# Patient Record
Sex: Female | Born: 1995 | Race: Black or African American | Hispanic: No | Marital: Single | Smoking: Never smoker
Health system: Southern US, Community
[De-identification: ages and names within clinical notes are randomized; demographics above are authoritative.]

## PROBLEM LIST (undated history)

## (undated) DIAGNOSIS — I10 Essential (primary) hypertension: Secondary | ICD-10-CM

## (undated) DIAGNOSIS — T7840XA Allergy, unspecified, initial encounter: Secondary | ICD-10-CM

## (undated) DIAGNOSIS — E669 Obesity, unspecified: Secondary | ICD-10-CM

## (undated) HISTORY — PX: ADENOIDECTOMY: SUR15

## (undated) HISTORY — DX: Obesity, unspecified: E66.9

## (undated) HISTORY — DX: Allergy, unspecified, initial encounter: T78.40XA

## (undated) HISTORY — PX: WISDOM TOOTH EXTRACTION: SHX21

---
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
## 2016-01-15 ENCOUNTER — Ambulatory Visit: Payer: 59 | Admitting: Dietician

## 2016-02-03 ENCOUNTER — Encounter: Payer: 59 | Attending: Internal Medicine | Admitting: Dietician

## 2016-02-03 DIAGNOSIS — R7301 Impaired fasting glucose: Secondary | ICD-10-CM | POA: Insufficient documentation

## 2016-02-03 DIAGNOSIS — E669 Obesity, unspecified: Secondary | ICD-10-CM

## 2016-02-03 DIAGNOSIS — Z713 Dietary counseling and surveillance: Secondary | ICD-10-CM | POA: Diagnosis not present

## 2016-02-03 NOTE — Patient Instructions (Addendum)
Aim to eat 3 meals per day. For all meals, have protein with starch/carb For breakfast - try egg sandwich or fruit with cheese/meat/peanut butter/nuts/eggs Aim to fill half of your plate with vegetables, quarter of your plate protein, and a quarter of your plate starch/fruit. Keep frozen and ready to eat fresh vegetables on hand to add to meals. Try to eat at a table without distractions when possible. Try cutting back on sweet drinks. You can dilute them in water or have some juice in sparkling water. Or try adding fruit to water.  Try Diet V8 Splash (10 calories).  Look into joining the Y when your schedule gets better. (Goal is 150 minutes per weeks). Plan to cook 2 times per week now (crazy) and cook more when schedule gets better.

## 2016-02-03 NOTE — Progress Notes (Signed)
  Medical Nutrition Therapy:  Appt start time: 1115 end time:  1220.   Assessment:  Primary concerns today: Jordan Martin is here today since she would like to like to become healthier and not be overweight forever. She would like to be be able to "control what she eats". Feels like she doesn't make the best choices. Fasting blood sugar was 107 mg/dl in March. Does not remember if she ate before that test. Has a family hx of diabetes.   Went to [employer] for her 1st year of college and will be living at home and going to [employer] for the next 2 years. Did not feel comfortable at college and wanted to leave. Lives with her mom, brother and best friend. Everyone at home "fends for themselves". 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 Will miss breakfast a lot. Gets up at 10-11 AM most days. When she was at school was eating more food from the grocery store and at home eats all meals from fast food. Has been eating mostly fast food for meals since her dad died when she was 8. States that mom and brother don't eat very much since they are not hungry. Her best friend is interested in eating healthier now too.   Feels like she doesn't not just eat to eat but will eat when she is hungry.  Preferred Learning Style:   No preference indicated   Learning Readiness:   Ready   MEDICATIONS: none   DIETARY INTAKE:  Usual eating pattern includes 2 meals and 0 snacks per day.  Avoided foods include: onions, pretzels    24-hr recall:  B (10 -11 AM): McDonald's 2 sausage and cheese biscuits and fries  Snk ( AM): none  L ( PM): none Snk ( PM): non D (5-6 PM): Wendy's burger and fries or [employer] salad with fried or grilled chicken or chicken tenders Snk ( PM): non Beverages: gatorade, juice, water  Usual physical activity: walking sometimes but not regularly  Estimated energy needs: 2000 calories 225 g carbohydrates 150 g protein 56 g fat  Progress Towards Goal(s):   In progress.   Nutritional Diagnosis:  [place]-3.3 Overweight/obesity As related to hx of frequent fast food meals and sugar sweetened beverages.  As evidenced by BMI of 47.4.    Intervention:  Nutrition counseling provided. Plan: Aim to eat 3 meals per day. For all meals, have protein with starch/carb For breakfast - try egg sandwich or fruit with cheese/meat/peanut butter/nuts/eggs Aim to fill half of your plate with vegetables, quarter of your plate protein, and a quarter of your plate starch/fruit. Keep frozen and ready to eat fresh vegetables on hand to add to meals. Try to eat at a table without distractions when possible. Try cutting back on sweet drinks. You can dilute them in water or have some juice in sparkling water. Or try adding fruit to water.  Try Diet V8 Splash (10 calories).  Look into joining the Y when your schedule gets better. (Goal is 150 minutes per weeks). Plan to cook 2 times per week now (crazy) and cook more when schedule gets better.  Teaching Method Utilized:  Visual Auditory Hands on  Handouts given during visit include:  MyPlate Handout  15 g CHO Snacks  Meal Card  Barriers to learning/adherence to lifestyle change: none  Demonstrated degree of understanding via:  Teach Back   Monitoring/Evaluation:  Dietary intake, exercise, and body weight in 2 month(s).

## 2016-04-06 ENCOUNTER — Encounter: Payer: 59 | Attending: Internal Medicine | Admitting: Dietician

## 2016-04-06 DIAGNOSIS — IMO0002 Reserved for concepts with insufficient information to code with codable children: Secondary | ICD-10-CM

## 2016-04-06 DIAGNOSIS — Z713 Dietary counseling and surveillance: Secondary | ICD-10-CM | POA: Diagnosis not present

## 2016-04-06 DIAGNOSIS — Z68.41 Body mass index (BMI) pediatric, greater than or equal to 95th percentile for age: Secondary | ICD-10-CM

## 2016-04-06 DIAGNOSIS — R7301 Impaired fasting glucose: Secondary | ICD-10-CM | POA: Diagnosis present

## 2016-04-06 NOTE — Progress Notes (Signed)
  Medical Nutrition Therapy:  Appt start time: 200 end time:  230   Assessment:  Primary concerns today: Jordan Martin returns today with about a 1 lb weight loss. Has been drinking more water. On days when she can she adds more greens to her meals. 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 Lives just with her mom and brother now and trying to find a new living arrangement. Her friend moved out.  Still eating 1-2 meals per day. Feels like she has trouble eating more with her [employer] job. Tried G2 and doesn't like them much.   Planning to start using exercise bike for 1 hour each day.   She is looking into doing a service year and hopes to be gone by the end of the year.    Preferred Learning Style:   No preference indicated   Learning Readiness:   Ready   MEDICATIONS: none   DIETARY INTAKE:  Usual eating pattern includes 2 meals and 0 snacks per day.  Avoided foods include: onions, pretzels    24-hr recall:  B (8 AM): none or McDonald's sausage and cheese biscuits and fries  Snk ( AM): none  L ( PM): none or fruit Snk ( PM): none D (PM): eats what she cooks for the kids - spaghetti, [employer] meals, hot dogs or eats out Saturday, Sunday, and Monday Snk ( PM): none or sweets sometimes Beverages: 1 bottle gatorade, water  Usual physical activity: walking sometimes but not regularly  Estimated energy needs: 2000 calories 225 g carbohydrates 150 g protein 56 g fat  Progress Towards Goal(s):  In progress.   Nutritional Diagnosis:  [place]-3.3 Overweight/obesity As related to hx of frequent fast food meals and sugar sweetened beverages.  As evidenced by BMI of 47.4.    Intervention:  Nutrition counseling provided. Plan: Aim to eat 3 meals per day. Plan to go to the store when you get paid.  For all meals, have protein with starch/carb For breakfast - try egg sandwich or fruit with cheese/meat/peanut  butter/nuts/eggs Aim to fill half of your plate with vegetables, quarter of your plate protein, and a quarter of your plate starch/fruit. Keep frozen and ready to eat fresh vegetables on hand to add to meals. Continue cutting back on sweet drinks.  Try Diet V8 Splash (10 calories).  Limit meals to no more than 10 meals per week.  Plan to use exercise bike or elliptical in afternoon 5 x week  Teaching Method Utilized:  Visual Auditory Hands on  Handouts given during visit include:  none  Barriers to learning/adherence to lifestyle change: none  Demonstrated degree of understanding via:  Teach Back   Monitoring/Evaluation:  Dietary intake, exercise, and body weight in 2 month(s).

## 2016-04-06 NOTE — Patient Instructions (Addendum)
Aim to eat 3 meals per day. Plan to go to the store when you get paid.  For all meals, have protein with starch/carb For breakfast - try egg sandwich or fruit with cheese/meat/peanut butter/nuts/eggs Aim to fill half of your plate with vegetables, quarter of your plate protein, and a quarter of your plate starch/fruit. Keep frozen and ready to eat fresh vegetables on hand to add to meals. Continue cutting back on sweet drinks.  Try Diet V8 Splash (10 calories).  Limit meals to no more than 10 meals per week.  Plan to use exercise bike or elliptical in afternoon 5 x week.

## 2016-05-20 ENCOUNTER — Ambulatory Visit: Payer: 59 | Admitting: Internal Medicine

## 2016-06-09 ENCOUNTER — Ambulatory Visit: Payer: 59 | Admitting: Dietician

## 2016-06-16 ENCOUNTER — Encounter: Payer: 59 | Attending: Internal Medicine | Admitting: Dietician

## 2016-06-16 DIAGNOSIS — R7301 Impaired fasting glucose: Secondary | ICD-10-CM | POA: Diagnosis present

## 2016-06-16 DIAGNOSIS — Z713 Dietary counseling and surveillance: Secondary | ICD-10-CM | POA: Insufficient documentation

## 2016-06-16 NOTE — Progress Notes (Signed)
  Medical Nutrition Therapy:  Appt start time: 200 end time:  240   Assessment:  Primary concerns today: Jordan Martin returns today with no weight change. Has not made a lot of changes in her eating since she was last here about 6 weeks ago. Will be getting her own apartment soon (January).   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  Still eating 1-2 meals per day. Did not like G2 or Diet V8 Splash.   Eats ice a lot. Has been told her iron is low in the past (right on the cusp).   Looking into becoming a pescatarian but does not really like fish that much. Overall looking to cut back on pork on beef.   Preferred Learning Style:   No preference indicated   Learning Readiness:   Ready   MEDICATIONS: none   DIETARY INTAKE:  Usual eating pattern includes 2 meals and 0 snacks per day.  Avoided foods include: onions, pretzels    24-hr recall:  B (8 AM): none or [employer] sausage and cheese biscuits and bo rounds  Snk ( AM): none  L ( PM): none  Snk ( PM): none D (PM): eats what she cooks for the kids - spaghetti, [employer] meals, hot dogs or eats out Saturday, Sunday, and Monday Snk ( PM): none  Beverages: 1 bottle gatorade, water  Usual physical activity: 30 minute exercise app about 2-3 x week   Estimated energy needs: 2000 calories 225 g carbohydrates 150 g protein 56 g fat  Progress Towards Goal(s):  In progress.   Nutritional Diagnosis:  [place]-3.3 Overweight/obesity As related to hx of frequent fast food meals and sugar sweetened beverages.  As evidenced by BMI of 47.4.    Intervention:  Nutrition counseling provided. Plan: Start meal plan when you moved out of the house:  Aim to eat 3 meals per day. Plan to go to the store when you get paid.  For all meals, have protein (egg, yogurt, chicken, [place], cheese, peanut butter) with starch/carb For breakfast - try egg sandwich or fruit with cheese/meat/peanut  butter/nuts/eggs Aim to fill half of your plate with vegetables, quarter of your plate protein, and a quarter of your plate starch/fruit. Keep frozen and ready to eat fresh vegetables on hand to add to meals. Limit restaurant meals to no more than 3 meals per week.   Continue to use exercise app. Consider taking multivitamin with iron.   Teaching Method Utilized:  Visual Auditory Hands on  Handouts given during visit include:  none  Barriers to learning/adherence to lifestyle change: none  Demonstrated degree of understanding via:  Teach Back   Monitoring/Evaluation:  Dietary intake, exercise, and body weight in 2 month(s).

## 2016-06-16 NOTE — Patient Instructions (Addendum)
Start meal plan when you moved out of the house:  Aim to eat 3 meals per day. Plan to go to the store when you get paid.  For all meals, have protein (egg, yogurt, chicken, [place], cheese, peanut butter) with starch/carb For breakfast - try egg sandwich or fruit with cheese/meat/peanut butter/nuts/eggs Aim to fill half of your plate with vegetables, quarter of your plate protein, and a quarter of your plate starch/fruit. Keep frozen and ready to eat fresh vegetables on hand to add to meals. Limit restaurant meals to no more than 3 meals per week.   Continue to use exercise app. Consider taking multivitamin with iron.

## 2016-08-19 ENCOUNTER — Ambulatory Visit: Payer: 59 | Admitting: Dietician

## 2016-08-19 NOTE — Progress Notes (Signed)
Pre visit review using our clinic review tool, if applicable. No additional management support is needed unless otherwise documented below in the visit note.  Chief Complaint  Patient presents with  . Referral    Pt would like to go to thereapy.    HPI: Jordan Martin 21 y.o. come in for Consideration of seeing a counselor because she feels anxious and somewhat down. He states that she is  Going through stress   And   did well 3.0 at [employer] first semester and then second semester some difficulties with she thought was a friend and losing other friends on campus and felt isolated and more anxious. She didn't reenroll for this master and came home. However she feels more anxious at home and wants to move on and get through. Stopped school for  [employer] and will go bavck to [facility] and then perhaps [facility] ? If from anxiety  depression  When was in [place] and anxious and now  Gotten worse.  HOme  3 br own room . 5   Not used to that.   Moms bf iuncle brother .   bp last 130/92 she has been forgetting to take the blood pressure medication but will take it if it is refill. She is seeing nutritionist which has been helpful. Not suicidal but sometimes feels that if she was not there it would be better. Sleeping okay. Some fatigue. Neg tad  No traumatic episodes but father died sudden MI when she was 8 years  ROS: See pertinent positives and negatives per HPI.  Past Medical History:  Diagnosis Date  . Allergy   . Obesity     Family History  Problem Relation Age of Onset  . Hypertension Mother   . Heart attack Father   . Diabetes Father   . Hyperlipidemia Father   . Aneurysm Father 40    brain aneurysm    Social History   Social History  . Marital status: Single    Spouse name: N/A  . Number of children: N/A  . Years of education: N/A   Social History Main Topics  . Smoking status: Never Smoker  . Smokeless tobacco: Never Used  . Alcohol use No  . Drug use: No  . Sexual  activity: No   Other Topics Concern  . None   Social History Narrative   36 to [redacted] weeks gestation 8 lbs. 3 oz. Delivered by C-section.   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   No pets .   [employer] came   Home after first semester  Social issues  3.0 average plans to go back to school    Outpatient Medications Prior to Visit  Medication Sig Dispense Refill  . naproxen (NAPROSYN) 250 MG tablet Take 1 tablet (250 mg total) by mouth 2 (two) times daily with a meal. 30 tablet 0  . triamterene-hydrochlorothiazide (MAXZIDE-25) 37.5-25 MG tablet Take 0.5 tablets by mouth daily. High blood pressure 90 tablet 1  . fluticasone (FLONASE) 50 MCG/ACT nasal spray 2 spray each nostril qd (Patient not taking: Reported on 08/21/2016) 16 g 11  . Norethindrone Acet-Ethinyl Est (MICROGESTIN 1.5/30  PO) Take 1 tablet by mouth daily.     No facility-administered medications prior to visit.      EXAM:  BP (!) 156/86 (BP Location: Right Arm, Patient Position: Sitting, Cuff Size: Large)   Temp 98.5 F (36.9 C) (Oral)   Wt 283 lb (128.4 kg)   BMI 47.09 kg/m   Body mass index is 47.09 kg/m.  GENERAL: vitals reviewed and listed above, alert, oriented, appears well hydrated and in no acute distress HEENT: atraumatic, conjunctiva  clear, no obvious abnormalities on inspection of external nose and ears PSYCH: pleasant and cooperative, Emotional at times but oriented normal thought process and speech. Lab Results  Component Value Date   WBC 6.0 10/08/2014   HGB 11.2 (L) 10/08/2014   HCT 34.1 (L) 10/08/2014   PLT 330.0 10/08/2014   GLUCOSE 107 (H) 09/24/2015   CHOL 123 10/08/2014   TRIG 44.0 10/08/2014   HDL 41.90 10/08/2014   LDLCALC 72 10/08/2014   ALT 17  10/08/2014   AST 19 10/08/2014   NA 140 09/24/2015   K 4.4 09/24/2015   CL 105 09/24/2015   CREATININE 0.80 09/24/2015   BUN 12 09/24/2015   CO2 26 09/24/2015   TSH 1.41 10/08/2014   HGBA1C 5.4 11/18/2015   BP Readings from Last 3 Encounters:  08/21/16 (!) 156/86  11/18/15 126/80  07/30/15 (!) 150/96   PHQ-SADS Somatic: 8 ha gi  tired GAD7:13 no panic  Worry dominant PHQ9:11 sleep and self feeling bad  Difficulty : very    ASSESSMENT AND PLAN:  Discussed the following assessment and plan:  Adjustment reaction with anxiety and depression - disc rx intervnetion expectaions  appt with counselor   Hypertension, unspecified type - need fu back o nmed and evaluate   1-2 months  Medication management She is overdue for follow-up of blood pressure and metabolic monitoring. She can continue with the nutritionist. [facility] and then follow-up with me 1-2 months. Discussed counseling appropriate for this situation medicine only if systolic and not moving through these issues. Patient was told she can contact us sign up from  My  chart things we need to help in the interim. It is good that she has dreams to continue goals. Total visit 30mins > 50% spent counseling and coordinating care as indicated in above note and in instructions to patient .  -Patient advised to return or notify health care team  if  new concerns arise.  Patient Instructions  Go back on the  The  maxide  For BP  And then plan  FU in  1-2   months   I agree with counseling help confidential to help get through   the anxiety and depressive sx  .  For reasons we discussed .   Get back with us earlier if needed  [facility]  Can check  On line   Or  [phone]  Check  If  Counselor is in network .  Get sleep  Some exercise  And outside time  .    Day light  . Is a mild antidepressant and  Anxiety help.      Neta MendsWanda K. Gareld Martin M.D.  Lab Results  Component Value Date   WBC 6.0  10/08/2014   HGB 11.2 (L) 10/08/2014   HCT 34.1 (L) 10/08/2014   PLT 330.0 10/08/2014   GLUCOSE 107 (H) 09/24/2015   CHOL 123 10/08/2014   TRIG 44.0 10/08/2014   HDL 41.90 10/08/2014  LDLCALC 72 10/08/2014   ALT 17 10/08/2014   AST 19 10/08/2014   NA 140 09/24/2015   K 4.4 09/24/2015   CL 105 09/24/2015   CREATININE 0.80 09/24/2015   BUN 12 09/24/2015   CO2 26 09/24/2015   TSH 1.41 10/08/2014   HGBA1C 5.4 11/18/2015

## 2016-08-21 ENCOUNTER — Encounter: Payer: Self-pay | Admitting: Internal Medicine

## 2016-08-21 ENCOUNTER — Ambulatory Visit (INDEPENDENT_AMBULATORY_CARE_PROVIDER_SITE_OTHER): Payer: 59 | Admitting: Internal Medicine

## 2016-08-21 VITALS — BP 156/86 | Temp 98.5°F | Wt 283.0 lb

## 2016-08-21 DIAGNOSIS — F4323 Adjustment disorder with mixed anxiety and depressed mood: Secondary | ICD-10-CM

## 2016-08-21 DIAGNOSIS — I10 Essential (primary) hypertension: Secondary | ICD-10-CM

## 2016-08-21 DIAGNOSIS — Z79899 Other long term (current) drug therapy: Secondary | ICD-10-CM | POA: Diagnosis not present

## 2016-08-21 MED ORDER — TRIAMTERENE-HCTZ 37.5-25 MG PO TABS: 0.5000 | ORAL_TABLET | Freq: Every day | ORAL | 1 refills | Status: DC

## 2016-08-21 MED ORDER — FLUTICASONE PROPIONATE 50 MCG/ACT NA SUSP: NASAL | 11 refills | Status: DC

## 2016-08-21 NOTE — Patient Instructions (Addendum)
Go back on the  The  maxide  For BP  And then plan  FU in  1-2   months   I agree with counseling help confidential to help get through   the anxiety and depressive sx  .  For reasons we discussed .   Get back with us earlier if needed  [facility]  Can check  On line   Or  [phone]  Check  If  Counselor is in network .  Get sleep  Some exercise  And outside time  .    Day light  . Is a mild antidepressant and  Anxiety help.

## 2016-09-01 ENCOUNTER — Ambulatory Visit (INDEPENDENT_AMBULATORY_CARE_PROVIDER_SITE_OTHER): Payer: 59 | Admitting: Licensed Clinical Social Worker

## 2016-09-01 DIAGNOSIS — F329 Major depressive disorder, single episode, unspecified: Secondary | ICD-10-CM | POA: Diagnosis not present

## 2016-09-16 ENCOUNTER — Ambulatory Visit: Payer: 59 | Admitting: Licensed Clinical Social Worker

## 2016-09-23 ENCOUNTER — Ambulatory Visit: Payer: 59 | Admitting: Licensed Clinical Social Worker

## 2016-09-29 ENCOUNTER — Ambulatory Visit: Payer: 59 | Admitting: Licensed Clinical Social Worker

## 2016-10-19 ENCOUNTER — Ambulatory Visit: Payer: 59 | Admitting: Internal Medicine

## 2016-10-19 NOTE — Progress Notes (Deleted)
No chief complaint on file.   HPI: Jordan Martin 21 y.o. come in for Chronic disease management  Fu  bp readings  Told to restart  meds  Adjustment reaction   Adjustment reaction with anxiety and depression - disc rx intervnetion expectaions  appt with counselor   Hypertension, unspecified type - need fu back o nmed and evaluate   1-2 months  Medication management She is overdue for follow-up of blood pressure and metabolic monitoring. She can continue with the nutritionist. She will restart the [facility] and then follow-up with me 1-2 months. Discussed counseling appropriate for this situation medicine only if systolic and not moving through these issues. Patient was told she can contact us sign up from  My  chart things we need to help in the interim. It is good that she has dreams to continue goals. Total visit > 50% spent counseling and coordinating care as indicated in above note and in instructions to patient .  -Patient advised to return or notify health care team  if  new concerns arise.  Patient Instructions  Go back on the  The  maxide  For BP  And then plan  FU in  1-2   months   I agree with counseling help confidential to help get through   the anxiety and depressive sx  .  For reasons we discussed .   Get back with [place] earlier if needed  [facility]  Can check  On line   Or  [phone]  Check  If  Counselor is in network .  Get sleep  Some exercise  And outside time  .    Day light  . Is a mild antidepressant and  Anxiety help.      Neta Mends. Jordan Martin M.D.  Recent Labs    ROS: See pertinent positives and negatives per HPI.  Past Medical History:  Diagnosis Date  . Allergy   . Obesity     Family History  Problem Relation Age of Onset  . Hypertension Mother   . Heart attack Father   . Diabetes Father   . Hyperlipidemia Father   . Aneurysm Father 40    brain aneurysm    Social History   Social History  . Marital  status: Single    Spouse name: N/A  . Number of children: N/A  . Years of education: N/A   Social History Main Topics  . Smoking status: Never Smoker  . Smokeless tobacco: Never Used  . Alcohol use No  . Drug use: No  . Sexual activity: No   Other Topics Concern  . Not on file   Social History Narrative   36 to [redacted] weeks gestation 8 lbs. 3 oz. Delivered by C-section.   No neonatal problems       [place]   Household of 5  with mom no DTS pet   Father died of sudden heart attack when he was in his 69s when [name] was younger  Age 66    Mom   [name] working full-time now Arts administrator   Two older siblings in good health one in the [employer].   No pets .   [employer] came   Home after first semester  Social issues  3.0 average plans to go back to school    Outpatient Medications Prior to Visit  Medication Sig Dispense Refill  . fluticasone (FLONASE) 50 MCG/ACT nasal spray 2 spray each nostril qd 16 g 11  .  naproxen (NAPROSYN) 250 MG tablet Take 1 tablet (250 mg total) by mouth 2 (two) times daily with a meal. 30 tablet 0  . triamterene-hydrochlorothiazide (MAXZIDE-25) 37.5-25 MG tablet Take 0.5 tablets by mouth daily. High blood pressure 90 tablet 1   No facility-administered medications prior to visit.      EXAM:  There were no vitals taken for this visit.  There is no height or weight on file to calculate BMI.  GENERAL: vitals reviewed and listed above, alert, oriented, appears well hydrated and in no acute distress HEENT: atraumatic, conjunctiva  clear, no obvious abnormalities on inspection of external nose and ears OP : no lesion edema or exudate  NECK: no obvious masses on inspection palpation  LUNGS: clear to auscultation bilaterally, no wheezes, rales or rhonchi, good air movement CV: HRRR, no clubbing cyanosis or  peripheral edema nl cap refill  MS: moves all extremities without noticeable focal  abnormality PSYCH: pleasant and cooperative, no obvious depression or  anxiety Lab Results  Component Value Date   WBC 6.0 10/08/2014   HGB 11.2 (L) 10/08/2014   HCT 34.1 (L) 10/08/2014   PLT 330.0 10/08/2014   GLUCOSE 107 (H) 09/24/2015   CHOL 123 10/08/2014   TRIG 44.0 10/08/2014   HDL 41.90 10/08/2014   LDLCALC 72 10/08/2014   ALT 17 10/08/2014   AST 19 10/08/2014   NA 140 09/24/2015   K 4.4 09/24/2015   CL 105 09/24/2015   CREATININE 0.80 09/24/2015   BUN 12 09/24/2015   CO2 26 09/24/2015   TSH 1.41 10/08/2014   HGBA1C 5.4 11/18/2015   BP Readings from Last 3 Encounters:  08/21/16 (!) 156/86  11/18/15 126/80  07/30/15 (!) 150/96   Wt Readings from Last 3 Encounters:  08/21/16 283 lb (128.4 kg) (>99 %, Z= 2.75)*  06/16/16 283 lb 8 oz (128.6 kg) (>99 %, Z= 2.74)*  04/06/16 283 lb 8 oz (128.6 kg) (>99 %, Z= 2.73)*   * Growth percentiles are based on CDC 2-20 Years data.    ASSESSMENT AND PLAN:  Discussed the following assessment and plan:  No diagnosis found. Lab today  Vs fasting  Lab   -Patient advised to return or notify health care team  if  new concerns arise.  There are no Patient Instructions on file for this visit.   Neta Mends. Emanuelle Hammerstrom M.D.

## 2017-05-24 IMAGING — DX DG ANKLE COMPLETE 3+V*L*
3 series · 3 of 3 positions shown · non-contrast
Comparison: None.

CLINICAL DATA: Pain and swelling in the left ankle over the past 4
days without known injury.

EXAM:
LEFT ANKLE COMPLETE - 3+ VIEW

[ankle ap]
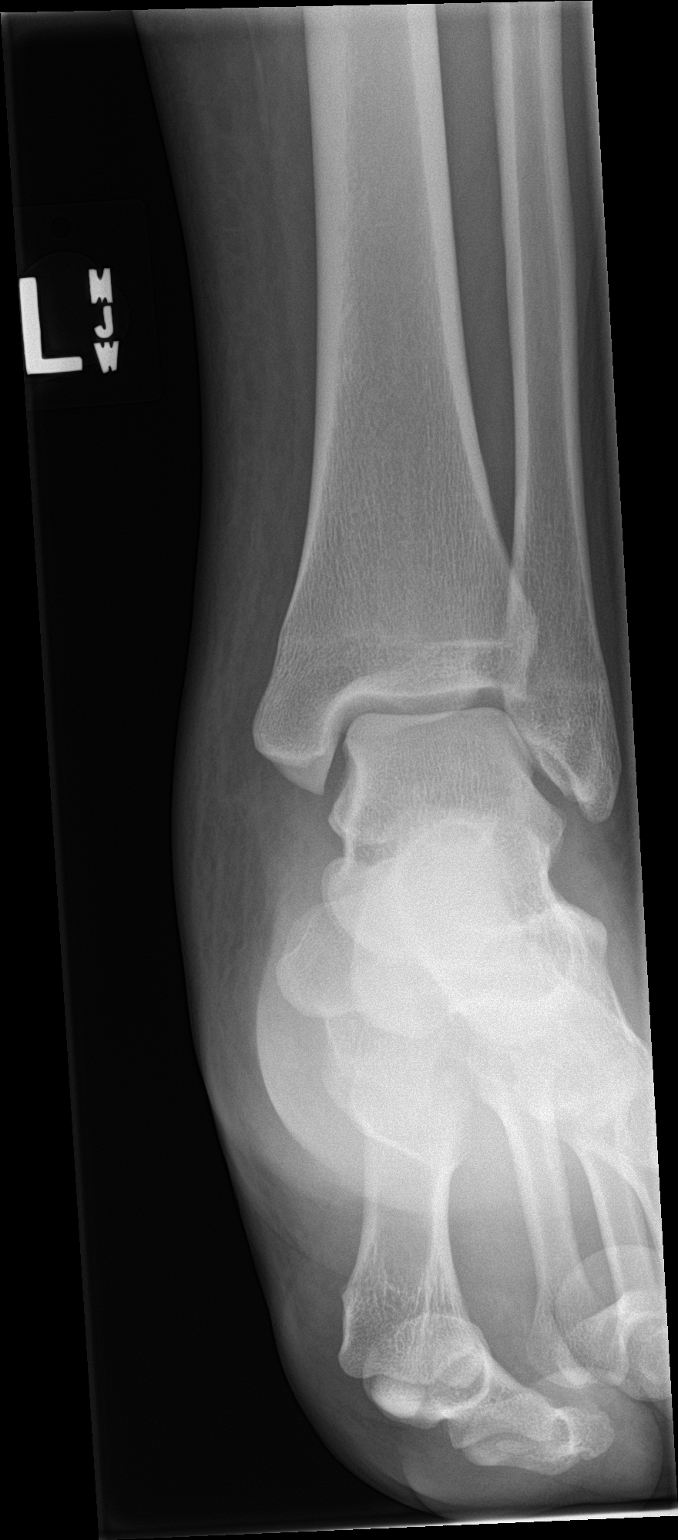

[ankle obl]
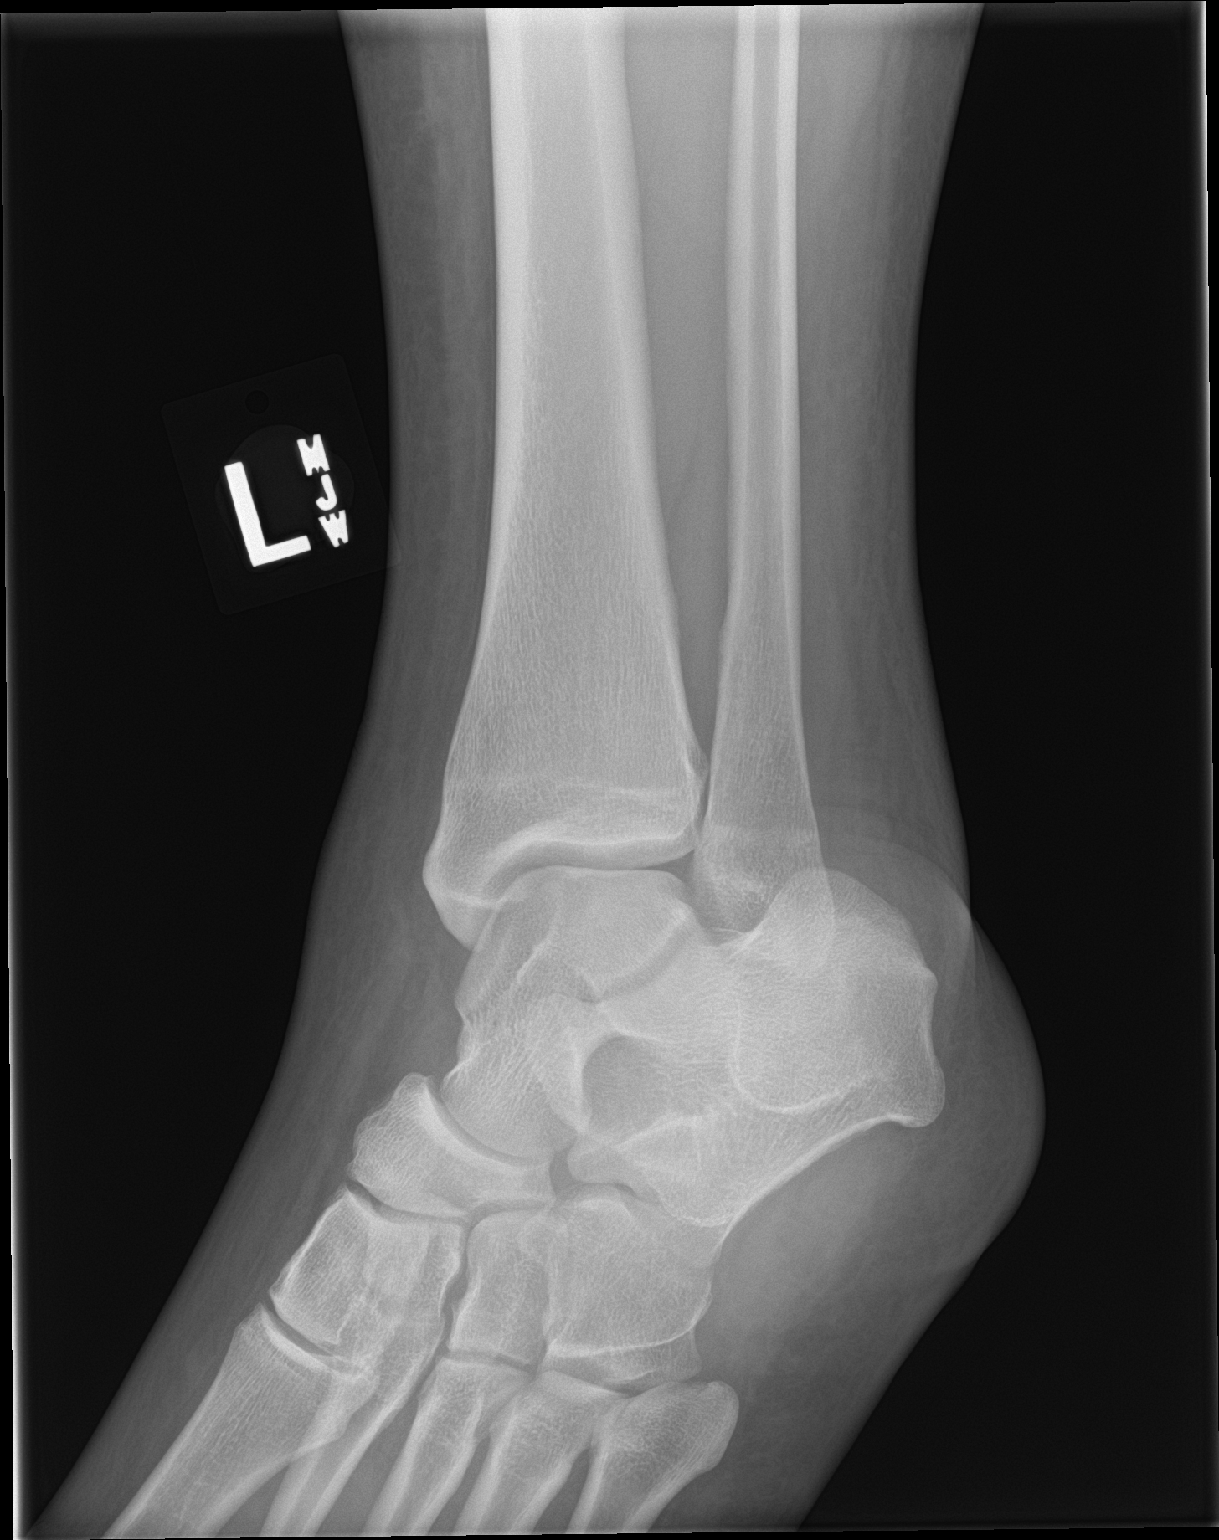

[ankle lat]
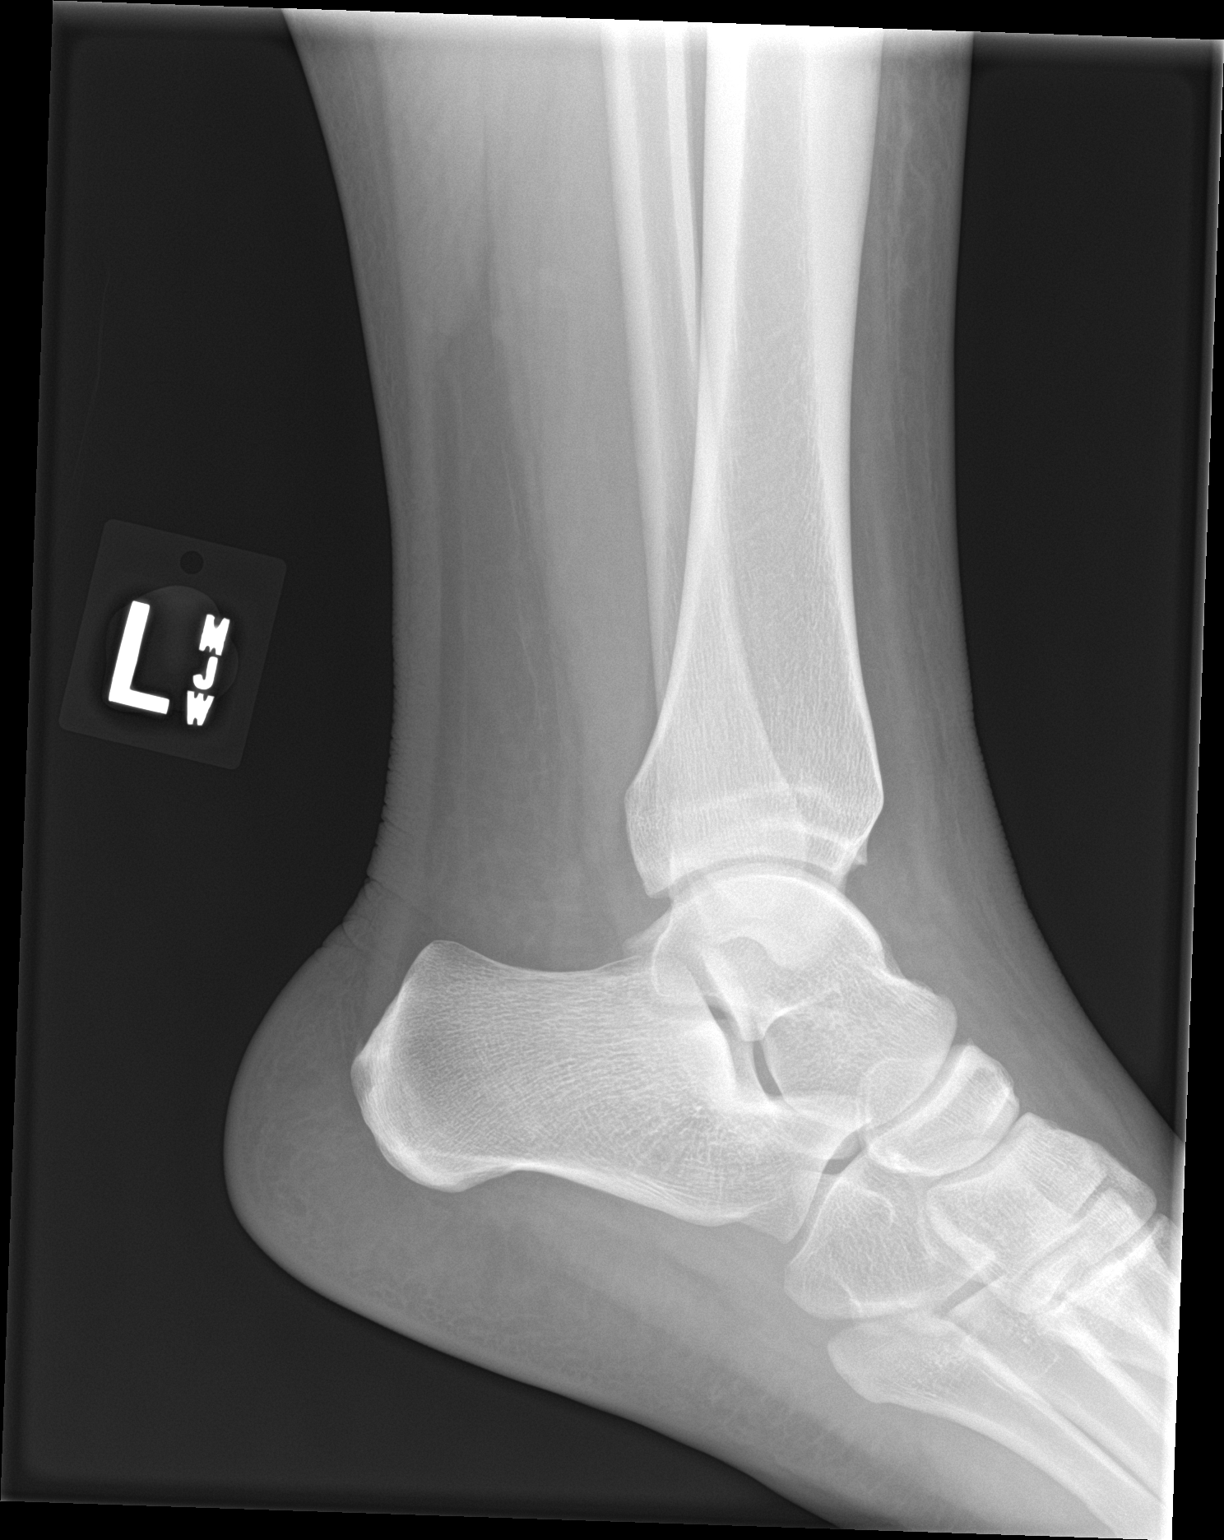

[3 of 3 positions shown; findings below may reference images not displayed]

FINDINGS: Soft tissue swelling overlies the medial malleolus, and likely the
lateral malleolus based on the oblique view. No underlying fracture.
Plafond and talar dome intact. No specific hindfoot or midfoot bony
abnormality is radiographically apparent.
IMPRESSION: 1. Soft tissue swelling over the malleoli without underlying bony
abnormality.

## 2017-08-30 ENCOUNTER — Emergency Department (HOSPITAL_COMMUNITY)
Admission: EM | Admit: 2017-08-30 | Discharge: 2017-08-30 | Disposition: A | Discharge status: Home / Self Care | Payer: 59 | Attending: Emergency Medicine | Admitting: Emergency Medicine

## 2017-08-30 ENCOUNTER — Other Ambulatory Visit: Payer: Self-pay | Admitting: Internal Medicine

## 2017-08-30 ENCOUNTER — Encounter (HOSPITAL_COMMUNITY): Payer: Self-pay | Admitting: Emergency Medicine

## 2017-08-30 ENCOUNTER — Emergency Department (HOSPITAL_COMMUNITY): Payer: 59

## 2017-08-30 DIAGNOSIS — I10 Essential (primary) hypertension: Secondary | ICD-10-CM | POA: Diagnosis not present

## 2017-08-30 DIAGNOSIS — R0789 Other chest pain: Secondary | ICD-10-CM

## 2017-08-30 DIAGNOSIS — Z79899 Other long term (current) drug therapy: Secondary | ICD-10-CM | POA: Diagnosis not present

## 2017-08-30 DIAGNOSIS — R079 Chest pain, unspecified: Secondary | ICD-10-CM | POA: Diagnosis present

## 2017-08-30 HISTORY — DX: Essential (primary) hypertension: I10

## 2017-08-30 LAB — BASIC METABOLIC PANEL
Anion gap: 9 (ref 5–15)
BUN: 12 mg/dL (ref 6–20)
CALCIUM: 9.2 mg/dL (ref 8.9–10.3)
CHLORIDE: 106 mmol/L (ref 101–111)
CO2: 21 mmol/L — AB (ref 22–32)
CREATININE: 0.84 mg/dL (ref 0.44–1.00)
GFR calc Af Amer: >60 mL/min (ref >60)
GFR calc non Af Amer: >60 mL/min (ref >60)
Glucose, Bld: 96 mg/dL (ref 65–99)
Potassium: 3.7 mmol/L (ref 3.5–5.1)
SODIUM: 136 mmol/L (ref 135–145)

## 2017-08-30 LAB — CBC
HCT: 37.5 % (ref 36.0–46.0)
Hemoglobin: 12.5 g/dL (ref 12.0–15.0)
MCH: 26.9 pg (ref 26.0–34.0)
MCHC: 33.3 g/dL (ref 30.0–36.0)
MCV: 80.8 fL (ref 78.0–100.0)
PLATELETS: 313 10*3/uL (ref 150–400)
RBC: 4.64 MIL/uL (ref 3.87–5.11)
RDW: 14.8 % (ref 11.5–15.5)
WBC: 7.2 10*3/uL (ref 4.0–10.5)

## 2017-08-30 LAB — I-STAT TROPONIN, ED: Troponin i, poc: 0 ng/mL (ref 0.00–0.08)

## 2017-08-30 LAB — I-STAT BETA HCG BLOOD, ED (MC, WL, AP ONLY): I-stat hCG, quantitative: <5 m[IU]/mL (ref <5)

## 2017-08-30 NOTE — ED Provider Notes (Signed)
[facility]-EMERGENCY DEPT Provider Note   CSN: [id number] Arrival date & time: 08/30/17  0807     History   Chief Complaint Chief Complaint  Patient presents with  . Chest Pain    HPI Jordan Martin is a 22 y.o. female.  22 yo F with a chief complaint of chest pain.  This is left-sided started last night.  Worse with movement palpation and deep breaths. Denies trauma, denies cough, congestion fever.  Denies hemoptysis.  Denies leg swelling.  No hx of MI.  Non smoker. HTN.  Denies DM.  Father with MI in the 5740's.  Denies prior PE or DVT.  Denies recent hospitalization denies recent surgery denies prone travel denies birth control use.   The history is provided by the patient.  Chest Pain   This is a new problem. The current episode started 2 days ago. The problem occurs constantly. The problem has not changed since onset.The pain is at a severity of 4/10. The pain is mild. The quality of the pain is described as pleuritic. The pain does not radiate. Duration of episode(s) is 2 days. Pertinent negatives include no dizziness, no fever, no headaches, no nausea, no palpitations, no shortness of breath and no vomiting. She has tried nothing for the symptoms. The treatment provided no relief. There are no known risk factors.  Pertinent negatives for past medical history include no DVT, no MI and no PE.  Her family medical history is significant for early MI (dad in 6940's).    Past Medical History:  Diagnosis Date  . Allergy   . Hypertension   . Obesity     Patient Active Problem List   Diagnosis Date Noted  . Tonsillar hypertrophy 07/31/2015  . Elevated BP 11/09/2014  . 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 Nail abnormality 10/08/2014  . BMI (body mass index), pediatric, > 99% for age 40/28/2016  . Tremor 12/27/2012  . Well adolescent visit 12/26/2012  . Acanthosis nigricans, acquired 10/06/2010  . Allergic rhinitis, seasonal 10/06/2010  . OBESITY 04/01/2007     Past Surgical History:  Procedure Laterality Date  . ADENOIDECTOMY    . WISDOM TOOTH EXTRACTION      OB History    No data available       Home Medications    Prior to Admission medications   Medication Sig Start Date End Date Taking? Authorizing Provider  fluticasone (FLONASE) 50 MCG/ACT nasal spray 2 spray each nostril qd Patient taking differently: Place 2 sprays into both nostrils daily as needed for allergies.  08/21/16  Yes Panosh, Neta MendsWanda K, MD  triamterene-hydrochlorothiazide (MAXZIDE-25) 37.5-25 MG tablet Take 0.5 tablets by mouth daily. High blood pressure 08/21/16  Yes Panosh, Neta MendsWanda K, MD    Family History Family History  Problem Relation Age of Onset  . Hypertension Mother   . Heart attack Father   . Diabetes Father   . Hyperlipidemia Father   . Aneurysm Father 40       brain aneurysm    Social History Social History   Tobacco Use  . Smoking status: Never Smoker  . Smokeless tobacco: Never Used  Substance Use Topics  . Alcohol use: No    Alcohol/week: 0.0 oz  . Drug use: Yes    Types: Marijuana     Allergies   Patient has no known allergies.   Review of Systems Review of Systems  Constitutional: Negative for chills and fever.  HENT: Negative for congestion and rhinorrhea.   Eyes:  Negative for redness and visual disturbance.  Respiratory: Negative for shortness of breath and wheezing.   Cardiovascular: Negative for chest pain and palpitations.  Gastrointestinal: Negative for nausea and vomiting.  Genitourinary: Negative for dysuria and urgency.  Musculoskeletal: Negative for arthralgias and myalgias.  Skin: Negative for pallor and wound.  Neurological: Negative for dizziness and headaches.     Physical Exam Updated Vital Signs BP (!) 158/109 (BP Location: Left Arm)   Pulse 64   Temp 97.9 F (36.6 C) (Oral)   Resp 19   LMP 08/06/2017   SpO2 97%   Physical Exam  Constitutional: She is oriented to person, place, and time. She appears  well-developed and well-nourished. No distress.  obese  HENT:  Head: Normocephalic and atraumatic.  Eyes: EOM are normal. Pupils are equal, round, and reactive to light.  Neck: Normal range of motion. Neck supple.  Cardiovascular: Normal rate and regular rhythm. Exam reveals no gallop and no friction rub.  No murmur heard. Pulmonary/Chest: Effort normal. She has no wheezes. She has no rales. She exhibits tenderness (along left chest wall, mid claviclar line about ribs 3-5).  Abdominal: Soft. She exhibits no distension. There is no tenderness.  Musculoskeletal: She exhibits no edema or tenderness.  Neurological: She is alert and oriented to person, place, and time.  Skin: Skin is warm and dry. She is not diaphoretic.  Psychiatric: She has a normal mood and affect. Her behavior is normal.  Nursing note and vitals reviewed.    ED Treatments / Results  Labs (all labs ordered are listed, but only abnormal results are displayed) Labs Reviewed  BASIC METABOLIC PANEL - Abnormal; Notable for the following components:      Result Value   CO2 21 (*)    All other components within normal limits  CBC  I-STAT TROPONIN, ED  I-STAT BETA HCG BLOOD, ED ([facility], [facility], [facility] ONLY)    EKG  EKG Interpretation  Date/Time:  Monday August 30 2017 08:16:34 EST Ventricular Rate:  63 PR Interval:    QRS Duration: 76 QT Interval:  418 QTC Calculation: 428 R Axis:   47 Text Interpretation:  Sinus rhythm No significant change since last tracing Confirmed by Melene Plan [phone]) on 08/30/2017 9:08:14 AM       Radiology Dg Chest 2 View  Result Date: 08/30/2017 CLINICAL DATA:  Chest pain EXAM: CHEST  2 VIEW COMPARISON:  None. FINDINGS: Normal heart size and mediastinal contours. No acute infiltrate or edema. No effusion or pneumothorax. No acute osseous findings. IMPRESSION: Negative chest. Electronically Signed   By: Marnee Spring M.D.   On: 08/30/2017 08:39    Procedures Procedures (including critical  care time)  Medications Ordered in ED Medications - No data to display   Initial Impression / Assessment and Plan / ED Course  I have reviewed the triage vital signs and the nursing notes.  Pertinent labs & imaging results that were available during my care of the patient were reviewed by me and considered in my medical decision making (see chart for details).     22 yo F with a chief complaint of left-sided chest pain.  Completely atypical from ACS.  PERC negative, labs ecg cxr reassuring d/c home.   9:35 AM:  I have discussed the diagnosis/risks/treatment options with the patient and family and believe the pt to be eligible for discharge home to follow-up with PCP. We also discussed returning to the ED immediately if new or worsening sx occur. We discussed the  sx which are most concerning (e.g., sudden worsening pain, fever, inability to tolerate by mouth) that necessitate immediate return. Medications administered to the patient during their visit and any new prescriptions provided to the patient are listed below.  Medications given during this visit Medications - No data to display   The patient appears reasonably screen and/or stabilized for discharge and I doubt any other medical condition or other [facility] requiring further screening, evaluation, or treatment in the ED at this time prior to discharge.    Final Clinical Impressions(s) / ED Diagnoses   Final diagnoses:  Chest wall pain    ED Discharge Orders    None       Melene Plan, DO 08/30/17 1610

## 2017-08-30 NOTE — Discharge Instructions (Signed)
Take 4 over the counter ibuprofen tablets 3 times a day or 2 over-the-counter naproxen tablets twice a day for pain. Also take tylenol 1000mg(2 extra strength) four times a day.    

## 2017-08-30 NOTE — ED Triage Notes (Signed)
Patient reports left sided chest pain that started yesterday when she moves her arm or takes a deep breath.

## 2017-11-15 ENCOUNTER — Other Ambulatory Visit: Payer: Self-pay | Admitting: Internal Medicine

## 2017-12-27 ENCOUNTER — Ambulatory Visit: Payer: Self-pay | Admitting: Internal Medicine

## 2017-12-27 DIAGNOSIS — Z0289 Encounter for other administrative examinations: Secondary | ICD-10-CM

## 2017-12-27 NOTE — Progress Notes (Deleted)
No chief complaint on file.   HPI: Jordan Martin 22 y.o. come in for  "fu"  Last seen feberuary 9   For  adsjutment reacitoin and elevated bp   Adjustment reaction with anxiety and depression - disc rx intervnetion expectaions  appt with counselor   Hypertension, unspecified type - need fu back o nmed and evaluate   1-2 months  Medication management She is overdue for follow-up of blood pressure and metabolic monitoring. She can continue with the nutritionist. She will restart the [facility] and then follow-up with me 1-2 months. Discussed counseling appropriate for this situation medicine only if systolic and not moving through these issues. Patient was told she can contact us sign up from  My  chart things we need to help in the interim. It is good that she has dreams to continue goals. Total visit 30mins > 50% spent counseling and coordinating care as indicated in above note and in instructions to patient .  -Patient advised to return or notify health care team  if  new concerns arise.  Patient Instructions  Go back on the  The  maxide  For BP  And then plan  FU in  1-2   months   I agree with counseling help confidential to help get through   the anxiety and depressive sx  .  For reasons we discussed .   Get back with us earlier if needed  [place] [facility]  Can check  On line   Or  [phone]  Check  If  Counselor is in network .  Get sleep  Some exercise  And outside time  .    Day light  . Is a mild antidepressant and  Anxiety help.     ROS: See pertinent positives and negatives per HPI.  Past Medical History:  Diagnosis Date  . Allergy   . Hypertension   . Obesity     Family History  Problem Relation Age of Onset  . Hypertension Mother   . Heart attack Father   . Diabetes Father   . Hyperlipidemia Father   . Aneurysm Father 40       brain aneurysm    Social History   Socioeconomic History  . Marital status: Single    Spouse name: Not on  file  . Number of children: Not on file  . Years of education: Not on file  . Highest education level: Not on file  Occupational History  . Not on file  Social Needs  . Financial resource strain: Not on file  . Food insecurity:    Worry: Not on file    Inability: Not on file  . Transportation needs:    Medical: Not on file    Non-medical: Not on file  Tobacco Use  . Smoking status: Never Smoker  . Smokeless tobacco: Never Used  Substance and Sexual Activity  . Alcohol use: No    Alcohol/week: 0.0 oz  . Drug use: Yes    Types: Marijuana  . Sexual activity: Never    Birth control/protection: Abstinence  Lifestyle  . Physical activity:    Days per week: Not on file    Minutes per session: Not on file  . Stress: Not on file  Relationships  . Social connections:    Talks on phone: Not on file    Gets together: Not on file    Attends religious service: Not on file    Active member of club or organization:  Not on file    Attends meetings of clubs or organizations: Not on file    Relationship status: Not on file  Other Topics Concern  . Not on file  Social History Narrative   36 to [redacted] weeks gestation 8 lbs. 3 oz. Delivered by C-section.   No neonatal problems       [place]   Household of 5  with mom no DTS pet   Father died of sudden heart attack when he was in his 54s when Dearia was younger  Age 29    Mom   [name] working full-time now Arts administrator   Two older siblings in good health one in the [employer].   No pets .   [employer] came   Home after first semester  Social issues  3.0 average plans to go back to school    Outpatient Medications Prior to Visit  Medication Sig Dispense Refill  . fluticasone (FLONASE) 50 MCG/ACT nasal spray 2 spray each nostril qd (Patient taking differently: Place 2 sprays into both nostrils daily as needed for allergies. ) 16 g 11  . ibuprofen (ADVIL,MOTRIN) 200 MG tablet Take 200 mg by mouth every 6 (six) hours as needed for headache.      Marland Kitchen OVER THE COUNTER MEDICATION Take 1 packet by mouth every 6 (six) hours as needed (For headache.). BC Aspirin Fast Pain Relief Powder Arthritis Formula    . triamterene-hydrochlorothiazide (MAXZIDE-25) 37.5-25 MG tablet TAKE 1/2 (ONE-HALF) TABLET BY MOUTH ONCE DAILY FOR  HIGH  BLOOD  PRESSURE 15 tablet 0   No facility-administered medications prior to visit.      EXAM:  There were no vitals taken for this visit.  There is no height or weight on file to calculate BMI.  GENERAL: vitals reviewed and listed above, alert, oriented, appears well hydrated and in no acute distress HEENT: atraumatic, conjunctiva  clear, no obvious abnormalities on inspection of external nose and ears OP : no lesion edema or exudate  NECK: no obvious masses on inspection palpation  LUNGS: clear to auscultation bilaterally, no wheezes, rales or rhonchi, good air movement CV: HRRR, no clubbing cyanosis or  peripheral edema nl cap refill  MS: moves all extremities without noticeable focal  abnormality PSYCH: pleasant and cooperative, no obvious depression or anxiety Lab Results  Component Value Date   WBC 7.2 08/30/2017   HGB 12.5 08/30/2017   HCT 37.5 08/30/2017   PLT 313 08/30/2017   GLUCOSE 96 08/30/2017   CHOL 123 10/08/2014   TRIG 44.0 10/08/2014   HDL 41.90 10/08/2014   LDLCALC 72 10/08/2014   ALT 17 10/08/2014   AST 19 10/08/2014   NA 136 08/30/2017   K 3.7 08/30/2017   CL 106 08/30/2017   CREATININE 0.84 08/30/2017   BUN 12 08/30/2017   CO2 21 (L) 08/30/2017   TSH 1.41 10/08/2014   HGBA1C 5.4 11/18/2015   BP Readings from Last 3 Encounters:  08/30/17 (!) 171/103  08/21/16 (!) 156/86  11/18/15 126/80    ASSESSMENT AND PLAN:  Discussed the following assessment and plan:  No diagnosis found. failuer to gollow up as planned  No showed in April  -Patient advised to return or notify health care team  if  new concerns arise.  There are no Patient Instructions on file for this  visit.   Neta Mends. Panosh M.D.

## 2017-12-28 ENCOUNTER — Telehealth: Payer: Self-pay | Admitting: Family Medicine

## 2017-12-28 NOTE — Telephone Encounter (Signed)
I left a voice message for pt to return our call. Patient was on schedule yesterday 12/27/2017 to see Dr. Fabian SharpPanosh and did not show for appointment. Please advise patient to reschedule this.

## 2018-02-22 NOTE — Progress Notes (Signed)
Chief Complaint  Patient presents with  . Medication Management    Maxide - good tolerance. Pt reports having frequent HAs. Atleast 4 days in the last week patient reports having headaches, pain is usually 8/10. Worst HA in July, pain 10/10.     HPI: Jordan Martin 22 y.o. come in for Chronic disease management   BP   Readings second read .  148/90  taking 1/2 pill Maxide nose  wegiht loss   By changing   What eats.  .  Not eating out as much     hx of headaches since a child .    recnet  More frequent.   Episodes   May take med and sleep  And then come back.   Has  15 yeheadaches per Sleeping a bit more  . 12 to  More comon at work .   skipps  Meals  No time.   Works in [employer] research [employer].   No food from awakening until around 4 pm cause of busy job etc    ROS: See pertinent positives and negatives per HPI. No cp sob mood much better at this time  No osa sx  Hs braces   Past Medical History:  Diagnosis Date  . Allergy   . Hypertension   . Obesity     Family History  Problem Relation Age of Onset  . Hypertension Mother   . Heart attack Father   . Diabetes Father   . Hyperlipidemia Father   . Aneurysm Father 40       brain aneurysm    Social History   Socioeconomic History  . Marital status: Single    Spouse name: Not on file  . Number of children: Not on file  . Years of education: Not on file  . Highest education level: Not on file  Occupational History  . Not on file  Social Needs  . Financial resource strain: Not on file  . Food insecurity:    Worry: Not on file    Inability: Not on file  . Transportation needs:    Medical: Not on file    Non-medical: Not on file  Tobacco Use  . Smoking status: Never Smoker  . Smokeless tobacco: Never Used  Substance and Sexual Activity  . Alcohol use: No    Alcohol/week: 0.0 standard drinks  . Drug use: Yes    Types: Marijuana  . Sexual activity: Never    Birth control/protection: Abstinence  Lifestyle  . Physical  activity:    Days per week: Not on file    Minutes per session: Not on file  . Stress: Not on file  Relationships  . Social connections:    Talks on phone: Not on file    Gets together: Not on file    Attends religious service: Not on file    Active member of club or organization: Not on file    Attends meetings of clubs or organizations: Not on file    Relationship status: Not on file  Other Topics Concern  . Not on file  Social History Narrative   36 to [redacted] weeks gestation 8 lbs. 3 oz. Delivered by C-section.   No neonatal problems       [place]   Household of 5  with mom no DTS pet   Father died of sudden heart attack when he was in his 540s when Jordan Martin was younger  Age 507    Mom   Jordan Martin working full-time now  personnel coordinator   Two older siblings in good health one in the [employer].   No pets .   [employer] came   Home after first semester  Social issues  3.0 average plans to go back to school    Outpatient Medications Prior to Visit  Medication Sig Dispense Refill  . fluticasone (FLONASE) 50 MCG/ACT nasal spray 2 spray each nostril qd (Patient taking differently: Place 2 sprays into both nostrils daily as needed for allergies. ) 16 g 11  . ibuprofen (ADVIL,MOTRIN) 200 MG tablet Take 200 mg by mouth every 6 (six) hours as needed for headache.    Marland Kitchen OVER THE COUNTER MEDICATION Take 1 packet by mouth every 6 (six) hours as needed (For headache.). BC Aspirin Fast Pain Relief Powder Arthritis Formula    . triamterene-hydrochlorothiazide (MAXZIDE-25) 37.5-25 MG tablet TAKE 1/2 (ONE-HALF) TABLET BY MOUTH ONCE DAILY FOR  HIGH  BLOOD  PRESSURE 15 tablet 0   No facility-administered medications prior to visit.      EXAM:  BP (!) 148/102 (BP Location: Right Arm, Patient Position: Sitting, Cuff Size: Large)   Pulse 100   Temp 98.4 F (36.9 C) (Oral)   Wt 258 lb 1.6 oz (117.1 kg)   BMI 42.95 kg/m   Body mass index is 42.95 kg/m.  GENERAL: vitals reviewed and listed above, alert,  oriented, appears well hydrated and in no acute distress HEENT: atraumatic, conjunctiva  clear, no obvious abnormalities on inspection of external nose and ears  NECK: no obvious masses on inspection palpation  LUNGS: clear to auscultation bilaterally, no wheezes, rales or rhonchi, good air movement CV: HRRR, no clubbing cyanosis or  peripheral edema nl cap refill  MS: moves all extremities without noticeable focal  abnormality PSYCH: pleasant and cooperative, no obvious depression or anxiety looks well today  Lab Results  Component Value Date   WBC 7.2 08/30/2017   HGB 12.5 08/30/2017   HCT 37.5 08/30/2017   PLT 313 08/30/2017   GLUCOSE 96 08/30/2017   CHOL 123 10/08/2014   TRIG 44.0 10/08/2014   HDL 41.90 10/08/2014   LDLCALC 72 10/08/2014   ALT 17 10/08/2014   AST 19 10/08/2014   NA 136 08/30/2017   K 3.7 08/30/2017   CL 106 08/30/2017   CREATININE 0.84 08/30/2017   BUN 12 08/30/2017   CO2 21 (L) 08/30/2017   TSH 1.41 10/08/2014   HGBA1C 5.4 11/18/2015   BP Readings from Last 3 Encounters:  02/23/18 (!) 148/102  08/30/17 (!) 171/103  08/21/16 (!) 156/86   Wt Readings from Last 3 Encounters:  02/23/18 258 lb 1.6 oz (117.1 kg)  08/21/16 283 lb (128.4 kg) (>99 %, Z= 2.75)*  06/16/16 283 lb 8 oz (128.6 kg) (>99 %, Z= 2.74)*   * Growth percentiles are based on CDC (Girls, 2-20 Years) data.    ASSESSMENT AND PLAN:  Discussed the following assessment and plan:  Hypertension, unspecified type - Plan: Basic metabolic panel  Medication management - Plan: Basic metabolic panel  BMI (body mass index), pediatric, > 99% for age  Headache, unspecified headache type  Class 3 severe obesity with body mass index (BMI) of 40.0 to 44.9 in adult, unspecified obesity type, unspecified whether serious comorbidity present [facility]) Doing  much better  But ha could be from long fasting from busy work schedule   No alarm features x frequency  Plan  HA   Calendar  Counseled. About poss   Food  during day  fruit and other  protein  And continue healthy weight loss  Inc Maxzide o 1 per day   rov 3 mos  See below  Encouraged healthy weight loss  Has lost 25#     -Patient advised to return or notify health care team  if  new concerns arise. Total visit 25mins > 50% spent counseling and coordinating care as indicated in above note and in instructions to patient .   Patient Instructions   Continue   Healthy weight loss ... Add something to eat  Fruit nuts  With measure proportions    ( however some nuts aggravatee migraine in some people)   headache calendar .   To help control headaches .  Take whole pill of   bp medication Plan rov in  About 3 months and will check  Bmp  potassium etc and bp readings     Let us know if  bp not going right direction  .  Best is 120/80  But 130/80n acceptable .   Wt Readings from Last 3 Encounters:  02/23/18 258 lb 1.6 oz (117.1 kg)  08/21/16 283 lb (128.4 kg) (>99 %, Z= 2.75)*  06/16/16 283 lb 8 oz (128.6 kg) (>99 %, Z= 2.74)*   * Growth percentiles are based on CDC (Girls, 2-20 Years) data.         Neta MendsWanda K. Panosh M.D

## 2018-02-23 ENCOUNTER — Encounter: Payer: Self-pay | Admitting: Internal Medicine

## 2018-02-23 ENCOUNTER — Ambulatory Visit (INDEPENDENT_AMBULATORY_CARE_PROVIDER_SITE_OTHER): Payer: Self-pay | Admitting: Internal Medicine

## 2018-02-23 VITALS — BP 148/102 | HR 100 | Temp 98.4°F | Wt 258.1 lb

## 2018-02-23 DIAGNOSIS — R519 Headache, unspecified: Secondary | ICD-10-CM | POA: Insufficient documentation

## 2018-02-23 DIAGNOSIS — Z79899 Other long term (current) drug therapy: Secondary | ICD-10-CM

## 2018-02-23 DIAGNOSIS — Z68.41 Body mass index (BMI) pediatric, greater than or equal to 95th percentile for age: Secondary | ICD-10-CM

## 2018-02-23 DIAGNOSIS — I10 Essential (primary) hypertension: Secondary | ICD-10-CM

## 2018-02-23 DIAGNOSIS — Z6841 Body Mass Index (BMI) 40.0 and over, adult: Secondary | ICD-10-CM

## 2018-02-23 DIAGNOSIS — R51 Headache: Secondary | ICD-10-CM

## 2018-02-23 MED ORDER — TRIAMTERENE-HCTZ 37.5-25 MG PO TABS: 1.0000 | ORAL_TABLET | Freq: Every day | ORAL | 1 refills | Status: DC

## 2018-02-23 NOTE — Patient Instructions (Signed)
Continue   Healthy weight loss ... Add something to eat  Fruit nuts  With measure proportions    ( however some nuts aggravatee migraine in some people)   headache calendar .   To help control headaches .  Take whole pill of   bp medication Plan rov in  About 3 months and will check  Bmp  potassium etc and bp readings     Let us know if  bp not going right direction  .  Best is 120/80  But 130/80n acceptable .   Wt Readings from Last 3 Encounters:  02/23/18 258 lb 1.6 oz (117.1 kg)  08/21/16 283 lb (128.4 kg) (>99 %, Z= 2.75)*  06/16/16 283 lb 8 oz (128.6 kg) (>99 %, Z= 2.74)*   * Growth percentiles are based on CDC (Girls, 2-20 Years) data.

## 2018-05-27 ENCOUNTER — Ambulatory Visit: Payer: Self-pay | Admitting: Internal Medicine

## 2018-05-27 DIAGNOSIS — Z0289 Encounter for other administrative examinations: Secondary | ICD-10-CM

## 2019-07-08 IMAGING — CR DG CHEST 2V
2 series · 2 of 2 positions shown · non-contrast
Comparison: None.

CLINICAL DATA: Chest pain

EXAM:
CHEST  2 VIEW

[w chest pa]
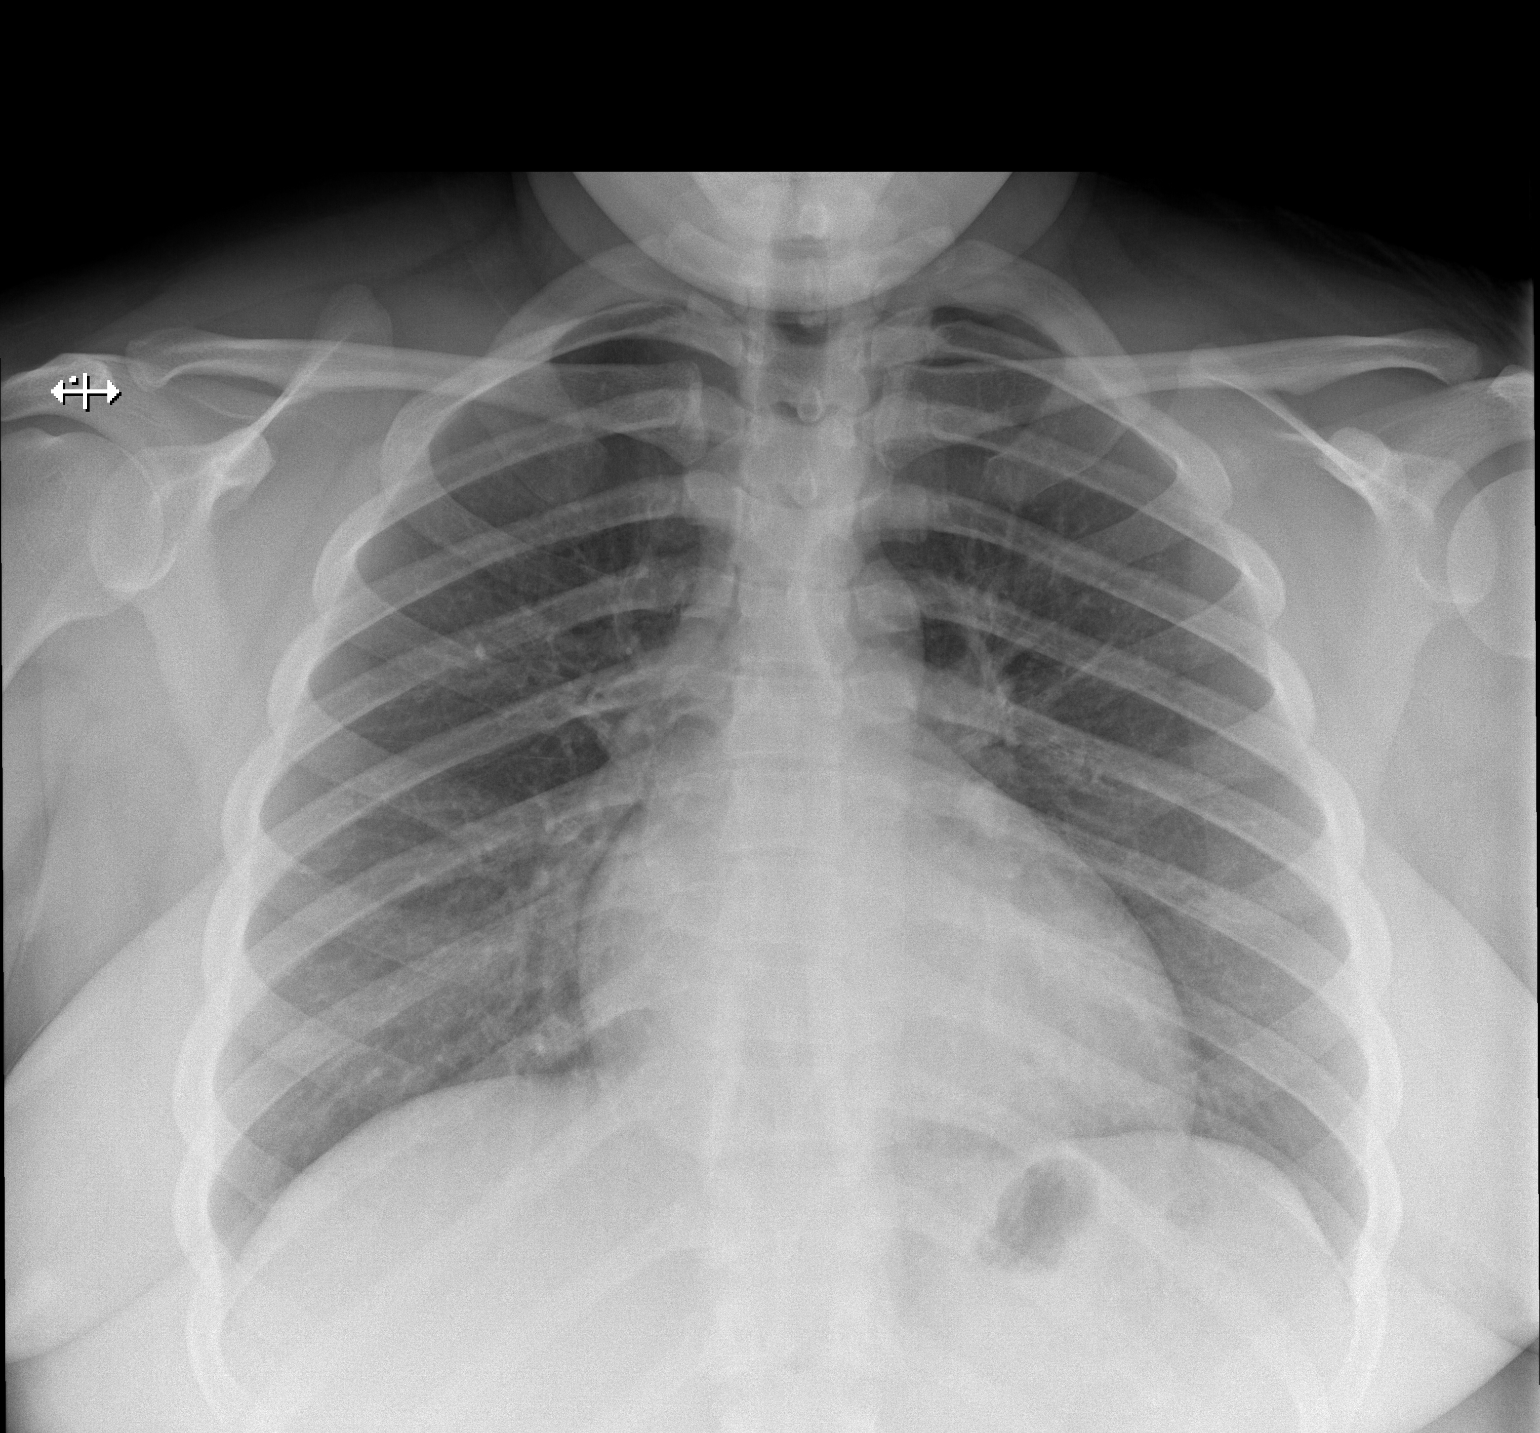

[w chest lat]
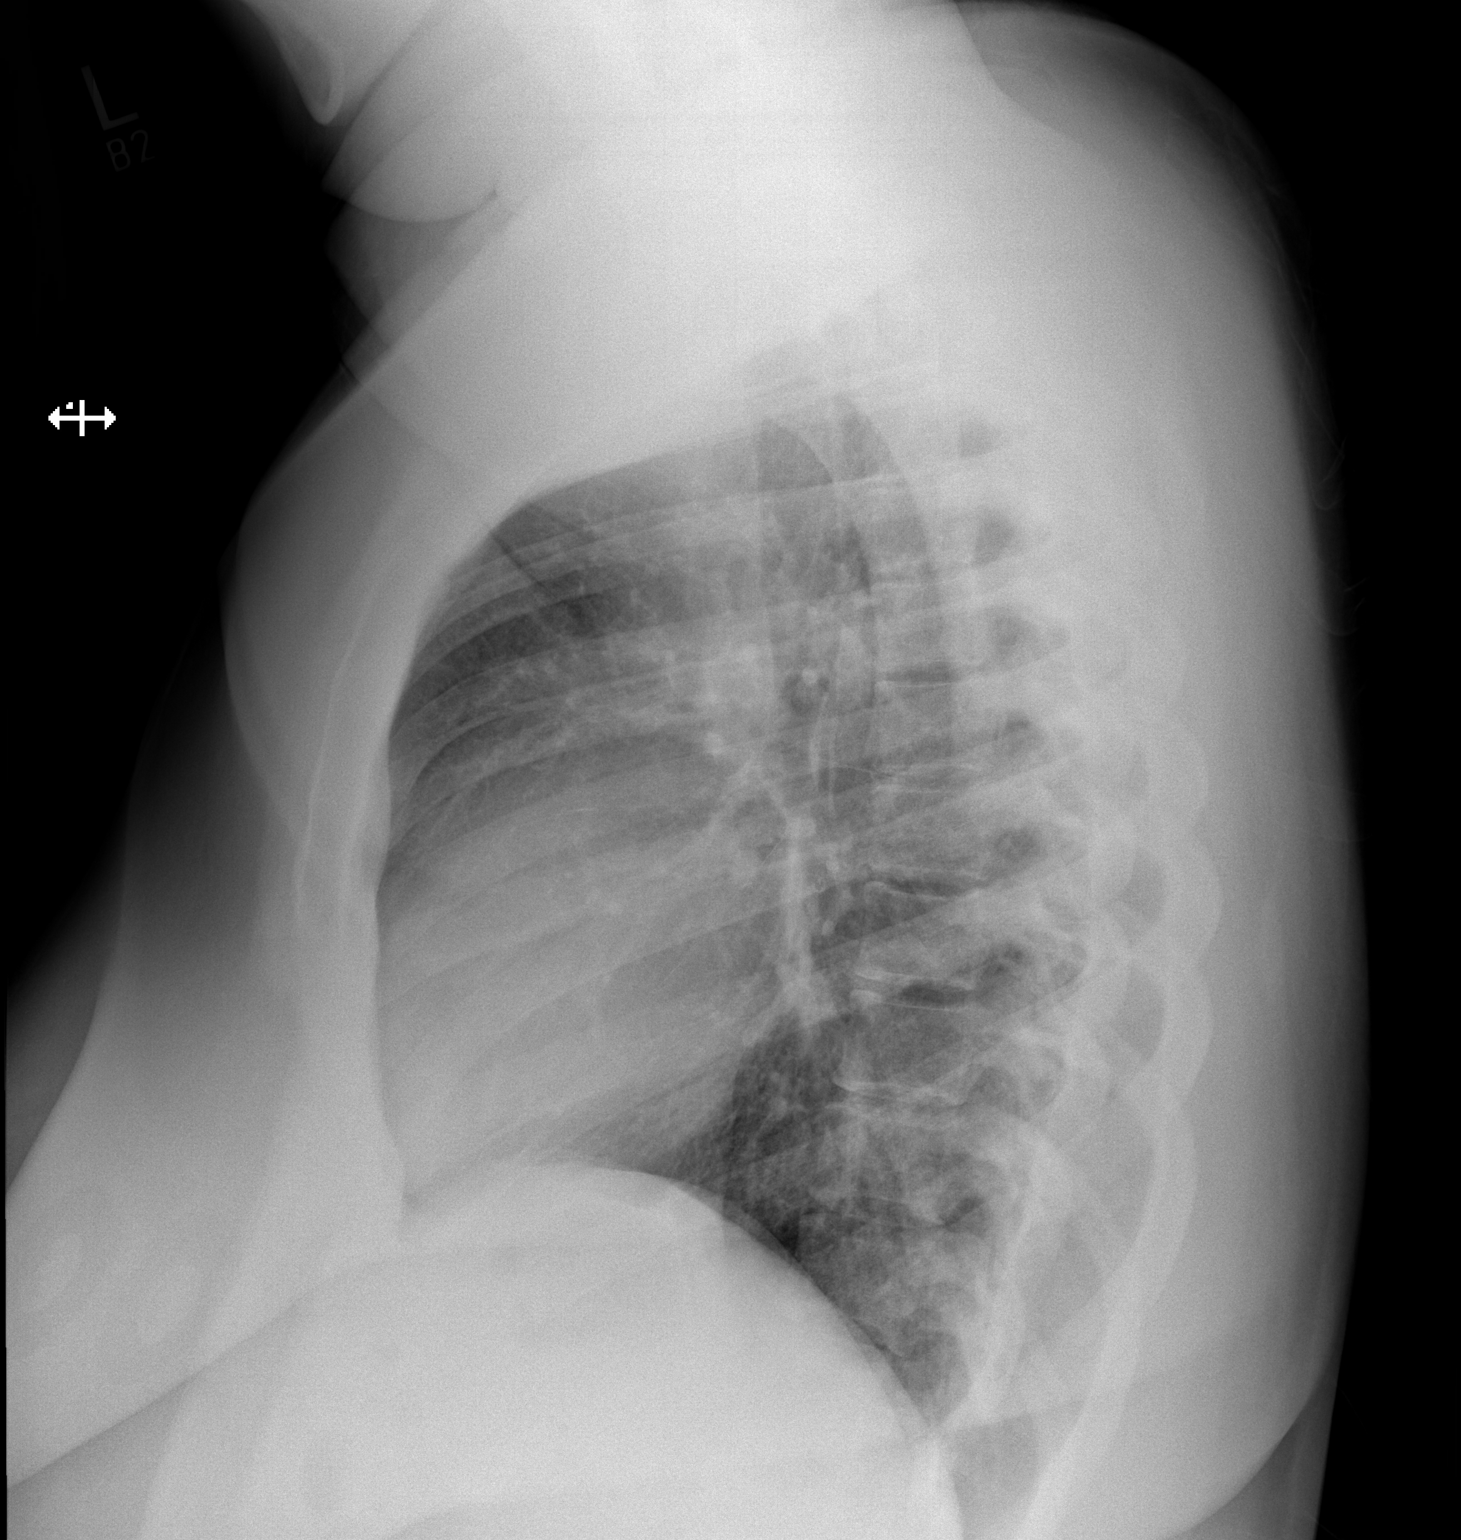

[2 of 2 positions shown; findings below may reference images not displayed]

FINDINGS: Normal heart size and mediastinal contours. No acute infiltrate or
edema. No effusion or pneumothorax. No acute osseous findings.
IMPRESSION: Negative chest.

## 2019-11-14 ENCOUNTER — Telehealth: Payer: Self-pay | Admitting: Internal Medicine

## 2019-11-14 NOTE — Telephone Encounter (Signed)
Called patient and LMOVM to return call  I left a detailed voice message to let patient know that there are a few things to go over with her.  It looks like she last had this filled in 2019 and I wanted to see if we could get an earlier appointment for her to come in.

## 2019-11-14 NOTE — Telephone Encounter (Signed)
  pt would like to know if she can have a refill for few pills until she comes  for her visit on 11/28/2019 for physical  triamterene-hydrochlorothiazide (MAXZIDE-25) 37.5-25 MG tablet [id number] - [place] ([place]), [place] - [address] pt contact number  [phone]

## 2019-11-28 ENCOUNTER — Other Ambulatory Visit: Payer: Self-pay

## 2019-11-28 ENCOUNTER — Ambulatory Visit (INDEPENDENT_AMBULATORY_CARE_PROVIDER_SITE_OTHER): Payer: Self-pay | Admitting: Internal Medicine

## 2019-11-28 ENCOUNTER — Encounter: Payer: Self-pay | Admitting: Internal Medicine

## 2019-11-28 VITALS — BP 154/104 | HR 87 | Temp 98.2°F | Ht 64.75 in | Wt 252.4 lb

## 2019-11-28 DIAGNOSIS — Z79899 Other long term (current) drug therapy: Secondary | ICD-10-CM

## 2019-11-28 DIAGNOSIS — Z6841 Body Mass Index (BMI) 40.0 and over, adult: Secondary | ICD-10-CM

## 2019-11-28 DIAGNOSIS — Z8249 Family history of ischemic heart disease and other diseases of the circulatory system: Secondary | ICD-10-CM

## 2019-11-28 DIAGNOSIS — I10 Essential (primary) hypertension: Secondary | ICD-10-CM

## 2019-11-28 DIAGNOSIS — Z0000 Encounter for general adult medical examination without abnormal findings: Secondary | ICD-10-CM

## 2019-11-28 MED ORDER — TRIAMTERENE-HCTZ 37.5-25 MG PO TABS: 1.0000 | ORAL_TABLET | Freq: Every day | ORAL | 0 refills | Status: DC

## 2019-11-28 NOTE — Progress Notes (Addendum)
Chief Complaint  Patient presents with  . Annual Exam    Doing well    HPI: Patient  Jordan Martin  24 y.o. comes in today for Preventive Health Care visit  And bp  Monitoring   No insurance  At this time when mom lost her job last year    Not monitoring  Had no machine but may get one. Very strong  fam hx of ht parent a sbi gps    She has been out of med for over a month   Had no se  Feels fine  But concern  Working on [employer]  Control many measures weight watchers and keto type eating seems to be helpful but not contiues  andkle puffy may have pulled it a while back  Health Maintenance  Topic Date Due  . HIV Screening  Never done  . COVID-19 Vaccine (1) Never done  . TETANUS/TDAP  03/31/2017  . PAP-Cervical Cytology Screening  Never done  . PAP SMEAR-Modifier  Never done  . INFLUENZA VACCINE  02/11/2020  last pap 2-3? Years ago?  obgyne and ok  Health Maintenance Review LIFESTYLE:  Exercise:   On and off  Jump rope .  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    ROS: left ankle swells but not pain ful  At this time ? Had old injury  GEN/ HEENT: No fever, significant weight changes sweats headaches vision problems hearing changes, CV/ PULM; No chest pain shortness of breath cough, syncope,edema  change in exercise tolerance. GI /GU: No adominal pain, vomiting, change in bowel habits. No blood in the stool. No significant GU symptoms. SKIN/HEME: ,no acute skin rashes suspicious lesions or bleeding. No lymphadenopathy, nodules, masses.  NEURO/ PSYCH:  No neurologic signs such as weakness numbness. No depression anxiety. IMM/ Allergy: No unusual infections.  Allergy .   REST of 12 system review negative except as per HPI   Past Medical History:  Diagnosis Date  . Allergy   . Hypertension   .  Obesity     Past Surgical History:  Procedure Laterality Date  . ADENOIDECTOMY    . WISDOM TOOTH EXTRACTION      Family History  Problem Relation Age of Onset  . Hypertension Mother   . Heart attack Father   . Diabetes Father   . Hyperlipidemia Father   . Aneurysm Father 40       brain aneurysm    Social History   Socioeconomic History  . Marital status: Single    Spouse name: Not on file  . Number of children: Not on file  . Years of education: Not on file  . Highest education level: Not on file  Occupational History  . Not on file  Tobacco Use  . Smoking status: Never Smoker  . Smokeless tobacco: Never Used  Substance and Sexual Activity  . Alcohol use: No    Alcohol/week: 0.0 standard drinks  . Drug use: Yes    Types: Marijuana  . Sexual activity: Never    Birth control/protection: Abstinence  Other Topics Concern  . Not on file  Social History Narrative   36 to [redacted] weeks gestation 8 lbs. 3 oz. Delivered by C-section.   No neonatal problems  [employer] of 5  with mom no DTS pet   Father died of sudden heart attack when he was in his 68s when Colandra was younger  Age 43    Mom   [name] working full-time now Arts administrator   Two older siblings in good health one in the [employer].   No pets .   [employer] came   Home after first semester  Social issues  3.0 average plans to go back to school   Social Determinants of Health   Financial Resource Strain:   . Difficulty of Paying Living Expenses:   Food Insecurity:   . Worried About Programme researcher, broadcasting/film/video in the Last Year:   . Barista in the Last Year:   Transportation Needs:   . Freight forwarder (Medical):   Marland Kitchen Lack of Transportation (Non-Medical):   Physical Activity:   . Days of Exercise per Week:   . Minutes of Exercise per Session:   Stress:   . Feeling of Stress :   Social Connections:   . Frequency of Communication with Friends and Family:   . Frequency of Social Gatherings  with Friends and Family:   . Attends Religious Services:   . Active Member of Clubs or Organizations:   . Attends Banker Meetings:   Marland Kitchen Marital Status:     Outpatient Medications Prior to Visit  Medication Sig Dispense Refill  . fluticasone (FLONASE) 50 MCG/ACT nasal spray 2 spray each nostril qd (Patient taking differently: Place 2 sprays into both nostrils daily as needed for allergies. ) 16 g 11  . ibuprofen (ADVIL,MOTRIN) 200 MG tablet Take 200 mg by mouth every 6 (six) hours as needed for headache.    Marland Kitchen OVER THE COUNTER MEDICATION Take 1 packet by mouth every 6 (six) hours as needed (For headache.). BC Aspirin Fast Pain Relief Powder Arthritis Formula    . triamterene-hydrochlorothiazide (MAXZIDE-25) 37.5-25 MG tablet Take 1 tablet by mouth daily. For high blood pressure 90 tablet 1   No facility-administered medications prior to visit.     EXAM:  BP (!) 154/104   Pulse 87   Temp 98.2 F (36.8 C) (Temporal)   Ht 5' 4.75" (1.645 m)   Wt 252 lb 6.4 oz (114.5 kg)   SpO2 99%   BMI 42.33 kg/m   Body mass index is 42.33 kg/m. Wt Readings from Last 3 Encounters:  11/28/19 252 lb 6.4 oz (114.5 kg)  02/23/18 258 lb 1.6 oz (117.1 kg)  08/21/16 283 lb (128.4 kg) (>99 %, Z= 2.75)*   * Growth percentiles are based on CDC (Girls, 2-20 Years) data.  bp readigns large 152/102 ;left  158/100 right  Large cuff     Physical Exam: Vital signs reviewed JOA:CZYS is a well-developed well-nourished alert cooperative    who appearsr stated age in no acute distress.  HEENT: normocephalic atraumatic , Eyes: PERRL EOM's full, conjunctiva clear, Nares: paten,t no deformity discharge or tenderness., Ears: no deformity EAC's clear TMs with normal landmarks. Mouth: masked NECK: supple without masses, thyromegaly or bruits. CHEST/PULM:  Clear to auscultation and percussion breath sounds equal no wheeze , rales or rhonchi. No chest wall deformities or tenderness. Breast: normal by  inspection . No dimpling, discharge, masses, tenderness or discharge . CV: PMI is nondisplaced, S1 S2 no gallops, murmurs, rubs. Peripheral pulses are full without delay.No JVD .  ABDOMEN: Bowel sounds normal nontender  No guard or rebound, no hepato splenomegal no  CVA tenderness. Extremtities:  No clubbing cyanosis or edema, no acute joint swelling or redness no focal atrophy left ankle puffy mild swelling  non tender    Med and lateral  NEURO:  Oriented x3, cranial nerves 3-12 appear to be intact, no obvious focal weakness,gait within normal limits no abnormal reflexes or asymmetrical SKIN: No acute rashes normal turgor, color, no bruising or petechiae. PSYCH: Oriented, good eye contact, no obvious depression anxiety, cognition and judgment appear normal. LN: no cervical axillary  adenopathy  Lab Results  Component Value Date   WBC 7.2 08/30/2017   HGB 12.5 08/30/2017   HCT 37.5 08/30/2017   PLT 313 08/30/2017   GLUCOSE 96 08/30/2017   CHOL 123 10/08/2014   TRIG 44.0 10/08/2014   HDL 41.90 10/08/2014   LDLCALC 72 10/08/2014   ALT 17 10/08/2014   AST 19 10/08/2014   NA 136 08/30/2017   K 3.7 08/30/2017   CL 106 08/30/2017   CREATININE 0.84 08/30/2017   BUN 12 08/30/2017   CO2 21 (L) 08/30/2017   TSH 1.41 10/08/2014   HGBA1C 5.4 11/18/2015    BP Readings from Last 3 Encounters:  11/28/19 (!) 154/104  02/23/18 (!) 148/102  08/30/17 (!) 171/103    Labplan  patient   ASSESSMENT AND PLAN:  Discussed the following assessment and plan:    ICD-10-CM   1. Visit for preventive health examination  Y77.41 Basic metabolic panel    Lipid panel    CBC with Differential/Platelet    Hemoglobin A1c  2. Hypertension, unspecified type  O87 Basic metabolic panel    Lipid panel    CBC with Differential/Platelet    Hemoglobin A1c  3. Medication management  O67.672 Basic metabolic panel    Lipid panel    CBC with Differential/Platelet    Hemoglobin A1c  4. Class 3 severe obesity  with body mass index (BMI) of 40.0 to 44.9 in adult, unspecified obesity type, unspecified whether serious comorbidity present (HCC)  C94.70 Basic metabolic panel   J62.83 Lipid panel    CBC with Differential/Platelet    Hemoglobin A1c  5. Family history of hypertension  Z82.49    Return for lab in 1-2 months and then go from there . Plan look [facility]  For insurance enrollment open until August 15  Plan fu sign up for my chart   Can get pap gyne check here or pp or gyne depending but she may be utd  Has had a pap at gyne in past .  Will be due for tdap .  bp control agree important disc  Avoiding processed carbs and read labels  She seems informed and motivated    Plan getting her own monitor and send in readings and plan fu   To include lab in 1-2 mos  Fasting preferred    Can make appt  And let us know    Patient Care Team: Emanuela Runnion, Standley Brooking, MD as PCP - General Servando Salina, MD as Consulting Physician (Obstetrics and Gynecology) Patient Instructions  look in tor open enrollment on the marketplace  To get  Affordable insurance .  Pap smears usually every 3 years and or if have problem .  Restart bp med but will need fu  Lab to make sure potassium etc are ok .   Goal bp  Is 120/80  Average  Healthy weight loss will also help  Dash eating  We may need to use different meds depending   age bp  monitor  Check twice a day for 3- 5 days and record .  And send in readings   Plan lab in 1-2 months     Health Maintenance, Female Adopting a healthy lifestyle and getting preventive care are important in promoting health and wellness. Ask your health care provider about:  The right schedule for you to have regular tests and exams.  Things you can do on your own to prevent diseases and keep yourself healthy. What should I know about diet, weight, and exercise? Eat a healthy diet   Eat a diet that includes plenty of vegetables, fruits, low-fat dairy products, and lean  protein.  Do not eat a lot of foods that are high in solid fats, added sugars, or sodium. Maintain a healthy weight Body mass index (BMI) is used to identify weight problems. It estimates body fat based on height and weight. Your health care provider can help determine your BMI and help you achieve or maintain a healthy weight. Get regular exercise Get regular exercise. This is one of the most important things you can do for your health. Most adults should:  Exercise for at least 150 minutes each week. The exercise should increase your heart rate and make you sweat (moderate-intensity exercise).  Do strengthening exercises at least twice a week. This is in addition to the moderate-intensity exercise.  Spend less time sitting. Even light physical activity can be beneficial. Watch cholesterol and blood lipids Have your blood tested for lipids and cholesterol at 23 years of age, then have this test every 5 years. Have your cholesterol levels checked more often if:  Your lipid or cholesterol levels are high.  You are older than 23 years of age.  You are at high risk for heart disease. What should I know about cancer screening? Depending on your health history and family history, you may need to have cancer screening at various ages. This may include screening for:  Breast cancer.  Cervical cancer.  Colorectal cancer.  Skin cancer.  Lung cancer. What should I know about heart disease, diabetes, and high blood pressure? Blood pressure and heart disease  High blood pressure causes heart disease and increases the risk of stroke. This is more likely to develop in people who have high blood pressure readings, are of African descent, or are overweight.  Have your blood pressure checked: ? Every 3-5 years if you are 79-77 years of age. ? Every year if you are 79 years old or older. Diabetes Have regular diabetes screenings. This checks your fasting blood sugar level. Have the screening  done:  Once every three years after age 59 if you are at a normal weight and have a low risk for diabetes.  More often and at a younger age if you are overweight or have a high risk for diabetes. What should I know about preventing infection? Hepatitis B If you have a higher risk for hepatitis B, you should be screened for this virus. Talk with your health care provider to find out if you are at risk for hepatitis B infection. Hepatitis C Testing is recommended for:  Everyone born from [birth year] through 1964.  Anyone with known risk factors for hepatitis C. Sexually transmitted infections (STIs)  Get screened for STIs, including gonorrhea and chlamydia, if: ? You are sexually active and are younger than 23 years of age. ? You are older than 23 years of age and your health care provider tells you that you are at risk for this  type of infection. ? Your sexual activity has changed since you were last screened, and you are at increased risk for chlamydia or gonorrhea. Ask your health care provider if you are at risk.  Ask your health care provider about whether you are at high risk for HIV. Your health care provider may recommend a prescription medicine to help prevent HIV infection. If you choose to take medicine to prevent HIV, you should first get tested for HIV. You should then be tested every 3 months for as long as you are taking the medicine. Pregnancy  If you are about to stop having your period (premenopausal) and you may become pregnant, seek counseling before you get pregnant.  Take 400 to 800 micrograms (mcg) of folic acid every day if you become pregnant.  Ask for birth control (contraception) if you want to prevent pregnancy. Osteoporosis and menopause Osteoporosis is a disease in which the bones lose minerals and strength with aging. This can result in bone fractures. If you are 31 years old or older, or if you are at risk for osteoporosis and fractures, ask your health care  provider if you should:  Be screened for bone loss.  Take a calcium or vitamin D supplement to lower your risk of fractures.  Be given hormone replacement therapy (HRT) to treat symptoms of menopause. Follow these instructions at home: Lifestyle  Do not use any products that contain nicotine or tobacco, such as cigarettes, e-cigarettes, and chewing tobacco. If you need help quitting, ask your health care provider.  Do not use street drugs.  Do not share needles.  Ask your health care provider for help if you need support or information about quitting drugs. Alcohol use  Do not drink alcohol if: ? Your health care provider tells you not to drink. ? You are pregnant, may be pregnant, or are planning to become pregnant.  If you drink alcohol: ? Limit how much you use to 0-1 drink a day. ? Limit intake if you are breastfeeding.  Be aware of how much alcohol is in your drink. In the [place] one drink equals one 12 oz bottle of beer (355 mL), one 5 oz glass of wine (148 mL), or one 1 oz glass of hard liquor (44 mL). General instructions  Schedule regular health, dental, and eye exams.  Stay current with your vaccines.  Tell your health care provider if: ? You often feel depressed. ? You have ever been abused or do not feel safe at home. Summary  Adopting a healthy lifestyle and getting preventive care are important in promoting health and wellness.  Follow your health care provider's instructions about healthy diet, exercising, and getting tested or screened for diseases.  Follow your health care provider's instructions on monitoring your cholesterol and blood pressure. This information is not intended to replace advice given to you by your health care provider. Make sure you discuss any questions you have with your health care provider. Document Revised: 06/22/2018 Document Reviewed: 06/22/2018 Elsevier Patient Education  2020 Elsevier Inc.     DASH Eating Plan DASH  stands for "Dietary Approaches to Stop Hypertension." The DASH eating plan is a healthy eating plan that has been shown to reduce high blood pressure (hypertension). It may also reduce your risk for type 2 diabetes, heart disease, and stroke. The DASH eating plan may also help with weight loss. What are tips for following this plan?  General guidelines  Avoid eating more than 2,300 mg (milligrams) of salt (sodium)  a day. If you have hypertension, you may need to reduce your sodium intake to 1,500 mg a day.  Limit alcohol intake to no more than 1 drink a day for nonpregnant women and 2 drinks a day for men. One drink equals 12 oz of beer, 5 oz of wine, or 1 oz of hard liquor.  Work with your health care provider to maintain a healthy body weight or to lose weight. Ask what an ideal weight is for you.  Get at least 30 minutes of exercise that causes your heart to beat faster (aerobic exercise) most days of the week. Activities may include walking, swimming, or biking.  Work with your health care provider or diet and nutrition specialist (dietitian) to adjust your eating plan to your individual calorie needs. Reading food labels   Check food labels for the amount of sodium per serving. Choose foods with less than 5 percent of the Daily Value of sodium. Generally, foods with less than 300 mg of sodium per serving fit into this eating plan.  To find whole grains, look for the word "whole" as the first word in the ingredient list. Shopping  Buy products labeled as "low-sodium" or "no salt added."  Buy fresh foods. Avoid canned foods and premade or frozen meals. Cooking  Avoid adding salt when cooking. Use salt-free seasonings or herbs instead of table salt or sea salt. Check with your health care provider or pharmacist before using salt substitutes.  Do not fry foods. Cook foods using healthy methods such as baking, boiling, grilling, and broiling instead.  Cook with heart-healthy oils,  such as olive, canola, soybean, or sunflower oil. Meal planning  Eat a balanced diet that includes: ? 5 or more servings of fruits and vegetables each day. At each meal, try to fill half of your plate with fruits and vegetables. ? Up to 6-8 servings of whole grains each day. ? Less than 6 oz of lean meat, poultry, or fish each day. A 3-oz serving of meat is about the same size as a deck of cards. One egg equals 1 oz. ? 2 servings of low-fat dairy each day. ? A serving of nuts, seeds, or beans 5 times each week. ? Heart-healthy fats. Healthy fats called Omega-3 fatty acids are found in foods such as flaxseeds and coldwater fish, like sardines, salmon, and mackerel.  Limit how much you eat of the following: ? Canned or prepackaged foods. ? Food that is high in trans fat, such as fried foods. ? Food that is high in saturated fat, such as fatty meat. ? Sweets, desserts, sugary drinks, and other foods with added sugar. ? Full-fat dairy products.  Do not salt foods before eating.  Try to eat at least 2 vegetarian meals each week.  Eat more home-cooked food and less restaurant, buffet, and fast food.  When eating at a restaurant, ask that your food be prepared with less salt or no salt, if possible. What foods are recommended? The items listed may not be a complete list. Talk with your dietitian about what dietary choices are best for you. Grains Whole-grain or whole-wheat bread. Whole-grain or whole-wheat pasta. Brown rice. Orpah Cobb. Bulgur. Whole-grain and low-sodium cereals. Pita bread. Low-fat, low-sodium crackers. Whole-wheat flour tortillas. Vegetables Fresh or frozen vegetables (raw, steamed, roasted, or grilled). Low-sodium or reduced-sodium tomato and vegetable juice. Low-sodium or reduced-sodium tomato sauce and tomato paste. Low-sodium or reduced-sodium canned vegetables. Fruits All fresh, dried, or frozen fruit. Canned fruit in natural juice (  without added sugar). Meat  and other protein foods Skinless chicken or [place]. Ground chicken or [place]. Pork with fat trimmed off. Fish and seafood. Egg whites. Dried beans, peas, or lentils. Unsalted nuts, nut butters, and seeds. Unsalted canned beans. Lean cuts of beef with fat trimmed off. Low-sodium, lean deli meat. Dairy Low-fat (1%) or fat-free (skim) milk. Fat-free, low-fat, or reduced-fat cheeses. Nonfat, low-sodium ricotta or cottage cheese. Low-fat or nonfat yogurt. Low-fat, low-sodium cheese. Fats and oils Soft margarine without trans fats. Vegetable oil. Low-fat, reduced-fat, or light mayonnaise and salad dressings (reduced-sodium). Canola, safflower, olive, soybean, and sunflower oils. Avocado. Seasoning and other foods Herbs. Spices. Seasoning mixes without salt. Unsalted popcorn and pretzels. Fat-free sweets. What foods are not recommended? The items listed may not be a complete list. Talk with your dietitian about what dietary choices are best for you. Grains Baked goods made with fat, such as croissants, muffins, or some breads. Dry pasta or rice meal packs. Vegetables Creamed or fried vegetables. Vegetables in a cheese sauce. Regular canned vegetables (not low-sodium or reduced-sodium). Regular canned tomato sauce and paste (not low-sodium or reduced-sodium). Regular tomato and vegetable juice (not low-sodium or reduced-sodium). Rosita Fire. Olives. Fruits Canned fruit in a light or heavy syrup. Fried fruit. Fruit in cream or butter sauce. Meat and other protein foods Fatty cuts of meat. Ribs. Fried meat. Tomasa Blase. Sausage. Bologna and other processed lunch meats. Salami. Fatback. Hotdogs. Bratwurst. Salted nuts and seeds. Canned beans with added salt. Canned or smoked fish. Whole eggs or egg yolks. Chicken or [place] with skin. Dairy Whole or 2% milk, cream, and half-and-half. Whole or full-fat cream cheese. Whole-fat or sweetened yogurt. Full-fat cheese. Nondairy creamers. Whipped toppings. Processed cheese and  cheese spreads. Fats and oils Butter. Stick margarine. Lard. Shortening. Ghee. Bacon fat. Tropical oils, such as coconut, palm kernel, or palm oil. Seasoning and other foods Salted popcorn and pretzels. Onion salt, garlic salt, seasoned salt, table salt, and sea salt. Worcestershire sauce. Tartar sauce. Barbecue sauce. Teriyaki sauce. Soy sauce, including reduced-sodium. Steak sauce. Canned and packaged gravies. Fish sauce. Oyster sauce. Cocktail sauce. Horseradish that you find on the shelf. Ketchup. Mustard. Meat flavorings and tenderizers. Bouillon cubes. Hot sauce and Tabasco sauce. Premade or packaged marinades. Premade or packaged taco seasonings. Relishes. Regular salad dressings. Where to find more information:  National Heart, Lung, and Blood Institute: PopSteam.is  American Heart Association: www.heart.org Summary  The DASH eating plan is a healthy eating plan that has been shown to reduce high blood pressure (hypertension). It may also reduce your risk for type 2 diabetes, heart disease, and stroke.  With the DASH eating plan, you should limit salt (sodium) intake to 2,300 mg a day. If you have hypertension, you may need to reduce your sodium intake to 1,500 mg a day.  When on the DASH eating plan, aim to eat more fresh fruits and vegetables, whole grains, lean proteins, low-fat dairy, and heart-healthy fats.  Work with your health care provider or diet and nutrition specialist (dietitian) to adjust your eating plan to your individual calorie needs. This information is not intended to replace advice given to you by your health care provider. Make sure you discuss any questions you have with your health care provider. Document Revised: 06/11/2017 Document Reviewed: 06/22/2016 Elsevier Patient Education  2020 [employer].    [place] K. Honorio Devol M.D.

## 2019-11-28 NOTE — Patient Instructions (Addendum)
look in tor open enrollment on the marketplace  To get  [employer] .  Pap smears usually every 3 years and or if have problem .  Restart bp med but will need fu  Lab to make sure potassium etc are ok .   Goal bp  Is 120/80  Average  Healthy weight loss will also help  Dash eating  We may need to use different meds depending   age bp monitor  Check twice a day for 3- 5 days and record .  And send in readings   Plan lab in 1-2 months     Health Maintenance, Female Adopting a healthy lifestyle and getting preventive care are important in promoting health and wellness. Ask your health care provider about:  The right schedule for you to have regular tests and exams.  Things you can do on your own to prevent diseases and keep yourself healthy. What should I know about diet, weight, and exercise? Eat a healthy diet   Eat a diet that includes plenty of vegetables, fruits, low-fat dairy products, and lean protein.  Do not eat a lot of foods that are high in solid fats, added sugars, or sodium. Maintain a healthy weight Body mass index (BMI) is used to identify weight problems. It estimates body fat based on height and weight. Your health care provider can help determine your BMI and help you achieve or maintain a healthy weight. Get regular exercise Get regular exercise. This is one of the most important things you can do for your health. Most adults should:  Exercise for at least 150 minutes each week. The exercise should increase your heart rate and make you sweat (moderate-intensity exercise).  Do strengthening exercises at least twice a week. This is in addition to the moderate-intensity exercise.  Spend less time sitting. Even light physical activity can be beneficial. Watch cholesterol and blood lipids Have your blood tested for lipids and cholesterol at 23 years of age, then have this test every 5 years. Have your cholesterol levels checked more often if:  Your  lipid or cholesterol levels are high.  You are older than 23 years of age.  You are at high risk for heart disease. What should I know about cancer screening? Depending on your health history and family history, you may need to have cancer screening at various ages. This may include screening for:  Breast cancer.  Cervical cancer.  Colorectal cancer.  Skin cancer.  Lung cancer. What should I know about heart disease, diabetes, and high blood pressure? Blood pressure and heart disease  High blood pressure causes heart disease and increases the risk of stroke. This is more likely to develop in people who have high blood pressure readings, are of African descent, or are overweight.  Have your blood pressure checked: ? Every 3-5 years if you are 42-22 years of age. ? Every year if you are 68 years old or older. Diabetes Have regular diabetes screenings. This checks your fasting blood sugar level. Have the screening done:  Once every three years after age 46 if you are at a normal weight and have a low risk for diabetes.  More often and at a younger age if you are overweight or have a high risk for diabetes. What should I know about preventing infection? Hepatitis B If you have a higher risk for hepatitis B, you should be screened for this virus. Talk with your health care provider to find out if you are at  risk for hepatitis B infection. Hepatitis C Testing is recommended for:  Everyone born from [birth year] through 1964.  Anyone with known risk factors for hepatitis C. Sexually transmitted infections (STIs)  Get screened for STIs, including gonorrhea and chlamydia, if: ? You are sexually active and are younger than 23 years of age. ? You are older than 23 years of age and your health care provider tells you that you are at risk for this type of infection. ? Your sexual activity has changed since you were last screened, and you are at increased risk for chlamydia or gonorrhea. Ask  your health care provider if you are at risk.  Ask your health care provider about whether you are at high risk for HIV. Your health care provider may recommend a prescription medicine to help prevent HIV infection. If you choose to take medicine to prevent HIV, you should first get tested for HIV. You should then be tested every 3 months for as long as you are taking the medicine. Pregnancy  If you are about to stop having your period (premenopausal) and you may become pregnant, seek counseling before you get pregnant.  Take 400 to 800 micrograms (mcg) of folic acid every day if you become pregnant.  Ask for birth control (contraception) if you want to prevent pregnancy. Osteoporosis and menopause Osteoporosis is a disease in which the bones lose minerals and strength with aging. This can result in bone fractures. If you are 54 years old or older, or if you are at risk for osteoporosis and fractures, ask your health care provider if you should:  Be screened for bone loss.  Take a calcium or vitamin D supplement to lower your risk of fractures.  Be given hormone replacement therapy (HRT) to treat symptoms of menopause. Follow these instructions at home: Lifestyle  Do not use any products that contain nicotine or tobacco, such as cigarettes, e-cigarettes, and chewing tobacco. If you need help quitting, ask your health care provider.  Do not use street drugs.  Do not share needles.  Ask your health care provider for help if you need support or information about quitting drugs. Alcohol use  Do not drink alcohol if: ? Your health care provider tells you not to drink. ? You are pregnant, may be pregnant, or are planning to become pregnant.  If you drink alcohol: ? Limit how much you use to 0-1 drink a day. ? Limit intake if you are breastfeeding.  Be aware of how much alcohol is in your drink. In the [place] one drink equals one 12 oz bottle of beer (355 mL), one 5 oz glass of wine  (148 mL), or one 1 oz glass of hard liquor (44 mL). General instructions  Schedule regular health, dental, and eye exams.  Stay current with your vaccines.  Tell your health care provider if: ? You often feel depressed. ? You have ever been abused or do not feel safe at home. Summary  Adopting a healthy lifestyle and getting preventive care are important in promoting health and wellness.  Follow your health care provider's instructions about healthy diet, exercising, and getting tested or screened for diseases.  Follow your health care provider's instructions on monitoring your cholesterol and blood pressure. This information is not intended to replace advice given to you by your health care provider. Make sure you discuss any questions you have with your health care provider. Document Revised: 06/22/2018 Document Reviewed: 06/22/2018 Elsevier Patient Education  2020 [employer].  DASH Eating Plan DASH stands for "Dietary Approaches to Stop Hypertension." The DASH eating plan is a healthy eating plan that has been shown to reduce high blood pressure (hypertension). It may also reduce your risk for type 2 diabetes, heart disease, and stroke. The DASH eating plan may also help with weight loss. What are tips for following this plan?  General guidelines  Avoid eating more than 2,300 mg (milligrams) of salt (sodium) a day. If you have hypertension, you may need to reduce your sodium intake to 1,500 mg a day.  Limit alcohol intake to no more than 1 drink a day for nonpregnant women and 2 drinks a day for men. One drink equals 12 oz of beer, 5 oz of wine, or 1 oz of hard liquor.  Work with your health care provider to maintain a healthy body weight or to lose weight. Ask what an ideal weight is for you.  Get at least 30 minutes of exercise that causes your heart to beat faster (aerobic exercise) most days of the week. Activities may include walking, swimming, or biking.  Work  with your health care provider or diet and nutrition specialist (dietitian) to adjust your eating plan to your individual calorie needs. Reading food labels   Check food labels for the amount of sodium per serving. Choose foods with less than 5 percent of the Daily Value of sodium. Generally, foods with less than 300 mg of sodium per serving fit into this eating plan.  To find whole grains, look for the word "whole" as the first word in the ingredient list. Shopping  Buy products labeled as "low-sodium" or "no salt added."  Buy fresh foods. Avoid canned foods and premade or frozen meals. Cooking  Avoid adding salt when cooking. Use salt-free seasonings or herbs instead of table salt or sea salt. Check with your health care provider or pharmacist before using salt substitutes.  Do not fry foods. Cook foods using healthy methods such as baking, boiling, grilling, and broiling instead.  Cook with heart-healthy oils, such as olive, canola, soybean, or sunflower oil. Meal planning  Eat a balanced diet that includes: ? 5 or more servings of fruits and vegetables each day. At each meal, try to fill half of your plate with fruits and vegetables. ? Up to 6-8 servings of whole grains each day. ? Less than 6 oz of lean meat, poultry, or fish each day. A 3-oz serving of meat is about the same size as a deck of cards. One egg equals 1 oz. ? 2 servings of low-fat dairy each day. ? A serving of nuts, seeds, or beans 5 times each week. ? Heart-healthy fats. Healthy fats called Omega-3 fatty acids are found in foods such as flaxseeds and coldwater fish, like sardines, salmon, and mackerel.  Limit how much you eat of the following: ? Canned or prepackaged foods. ? Food that is high in trans fat, such as fried foods. ? Food that is high in saturated fat, such as fatty meat. ? Sweets, desserts, sugary drinks, and other foods with added sugar. ? Full-fat dairy products.  Do not salt foods before  eating.  Try to eat at least 2 vegetarian meals each week.  Eat more home-cooked food and less restaurant, buffet, and fast food.  When eating at a restaurant, ask that your food be prepared with less salt or no salt, if possible. What foods are recommended? The items listed may not be a complete list. Talk with your dietitian  about what dietary choices are best for you. Grains Whole-grain or whole-wheat bread. Whole-grain or whole-wheat pasta. Brown rice. Orpah Cobb. Bulgur. Whole-grain and low-sodium cereals. Pita bread. Low-fat, low-sodium crackers. Whole-wheat flour tortillas. Vegetables Fresh or frozen vegetables (raw, steamed, roasted, or grilled). Low-sodium or reduced-sodium tomato and vegetable juice. Low-sodium or reduced-sodium tomato sauce and tomato paste. Low-sodium or reduced-sodium canned vegetables. Fruits All fresh, dried, or frozen fruit. Canned fruit in natural juice (without added sugar). Meat and other protein foods Skinless chicken or [place]. Ground chicken or [place]. Pork with fat trimmed off. Fish and seafood. Egg whites. Dried beans, peas, or lentils. Unsalted nuts, nut butters, and seeds. Unsalted canned beans. Lean cuts of beef with fat trimmed off. Low-sodium, lean deli meat. Dairy Low-fat (1%) or fat-free (skim) milk. Fat-free, low-fat, or reduced-fat cheeses. Nonfat, low-sodium ricotta or cottage cheese. Low-fat or nonfat yogurt. Low-fat, low-sodium cheese. Fats and oils Soft margarine without trans fats. Vegetable oil. Low-fat, reduced-fat, or light mayonnaise and salad dressings (reduced-sodium). Canola, safflower, olive, soybean, and sunflower oils. Avocado. Seasoning and other foods Herbs. Spices. Seasoning mixes without salt. Unsalted popcorn and pretzels. Fat-free sweets. What foods are not recommended? The items listed may not be a complete list. Talk with your dietitian about what dietary choices are best for you. Grains Baked goods made with fat,  such as croissants, muffins, or some breads. Dry pasta or rice meal packs. Vegetables Creamed or fried vegetables. Vegetables in a cheese sauce. Regular canned vegetables (not low-sodium or reduced-sodium). Regular canned tomato sauce and paste (not low-sodium or reduced-sodium). Regular tomato and vegetable juice (not low-sodium or reduced-sodium). Rosita Fire. Olives. Fruits Canned fruit in a light or heavy syrup. Fried fruit. Fruit in cream or butter sauce. Meat and other protein foods Fatty cuts of meat. Ribs. Fried meat. Tomasa Blase. Sausage. Bologna and other processed lunch meats. Salami. Fatback. Hotdogs. Bratwurst. Salted nuts and seeds. Canned beans with added salt. Canned or smoked fish. Whole eggs or egg yolks. Chicken or [place] with skin. Dairy Whole or 2% milk, cream, and half-and-half. Whole or full-fat cream cheese. Whole-fat or sweetened yogurt. Full-fat cheese. Nondairy creamers. Whipped toppings. Processed cheese and cheese spreads. Fats and oils Butter. Stick margarine. Lard. Shortening. Ghee. Bacon fat. Tropical oils, such as coconut, palm kernel, or palm oil. Seasoning and other foods Salted popcorn and pretzels. Onion salt, garlic salt, seasoned salt, table salt, and sea salt. Worcestershire sauce. Tartar sauce. Barbecue sauce. Teriyaki sauce. Soy sauce, including reduced-sodium. Steak sauce. Canned and packaged gravies. Fish sauce. Oyster sauce. Cocktail sauce. Horseradish that you find on the shelf. Ketchup. Mustard. Meat flavorings and tenderizers. Bouillon cubes. Hot sauce and Tabasco sauce. Premade or packaged marinades. Premade or packaged taco seasonings. Relishes. Regular salad dressings. Where to find more information:  National Heart, Lung, and Blood Institute: PopSteam.is  American Heart Association: www.heart.org Summary  The DASH eating plan is a healthy eating plan that has been shown to reduce high blood pressure (hypertension). It may also reduce your risk for  type 2 diabetes, heart disease, and stroke.  With the DASH eating plan, you should limit salt (sodium) intake to 2,300 mg a day. If you have hypertension, you may need to reduce your sodium intake to 1,500 mg a day.  When on the DASH eating plan, aim to eat more fresh fruits and vegetables, whole grains, lean proteins, low-fat dairy, and heart-healthy fats.  Work with your health care provider or diet and nutrition specialist (dietitian) to adjust your eating plan to  your individual calorie needs. This information is not intended to replace advice given to you by your health care provider. Make sure you discuss any questions you have with your health care provider. Document Revised: 06/11/2017 Document Reviewed: 06/22/2016 Elsevier Patient Education  2020 [employer].

## 2019-11-28 NOTE — Telephone Encounter (Signed)
Patient is at her appointment now and will discuss.

## 2020-05-03 ENCOUNTER — Other Ambulatory Visit (HOSPITAL_COMMUNITY)
Admission: RE | Admit: 2020-05-03 | Discharge: 2020-05-03 | Disposition: A | Discharge status: Home / Self Care | Payer: Self-pay | Source: Ambulatory Visit | Attending: Primary Care | Admitting: Primary Care

## 2020-05-03 ENCOUNTER — Encounter (INDEPENDENT_AMBULATORY_CARE_PROVIDER_SITE_OTHER): Payer: Self-pay | Admitting: Primary Care

## 2020-05-03 ENCOUNTER — Other Ambulatory Visit (INDEPENDENT_AMBULATORY_CARE_PROVIDER_SITE_OTHER): Payer: Self-pay | Admitting: Primary Care

## 2020-05-03 ENCOUNTER — Ambulatory Visit (INDEPENDENT_AMBULATORY_CARE_PROVIDER_SITE_OTHER): Payer: Self-pay | Admitting: Primary Care

## 2020-05-03 ENCOUNTER — Other Ambulatory Visit: Payer: Self-pay

## 2020-05-03 VITALS — BP 156/91 | HR 81 | Temp 97.7°F | Ht 64.75 in | Wt 264.0 lb

## 2020-05-03 DIAGNOSIS — I10 Essential (primary) hypertension: Secondary | ICD-10-CM

## 2020-05-03 DIAGNOSIS — Z1159 Encounter for screening for other viral diseases: Secondary | ICD-10-CM

## 2020-05-03 DIAGNOSIS — Z113 Encounter for screening for infections with a predominantly sexual mode of transmission: Secondary | ICD-10-CM | POA: Insufficient documentation

## 2020-05-03 DIAGNOSIS — Z131 Encounter for screening for diabetes mellitus: Secondary | ICD-10-CM

## 2020-05-03 DIAGNOSIS — Z6841 Body Mass Index (BMI) 40.0 and over, adult: Secondary | ICD-10-CM

## 2020-05-03 DIAGNOSIS — Z7689 Persons encountering health services in other specified circumstances: Secondary | ICD-10-CM

## 2020-05-03 LAB — POCT GLYCOSYLATED HEMOGLOBIN (HGB A1C): Hemoglobin A1C: 5.1 % (ref 4.0–5.6)

## 2020-05-03 LAB — GLUCOSE, POCT (MANUAL RESULT ENTRY): POC Glucose: 92 mg/dl (ref 70–99)

## 2020-05-03 MED ORDER — HYDROCHLOROTHIAZIDE 25 MG PO TABS: 25.0000 mg | ORAL_TABLET | Freq: Every day | ORAL | 3 refills | Status: DC

## 2020-05-03 MED ORDER — AMLODIPINE BESYLATE 10 MG PO TABS: 10.0000 mg | ORAL_TABLET | Freq: Every day | ORAL | 3 refills | Status: DC

## 2020-05-03 MED FILL — AMLODIPINE BESYLATE 10 MG T: 10 | 30 days supply | Qty: 30 | Fill #0

## 2020-05-03 MED FILL — HYDROCHLOROTHIAZIDE 25 MG T: 25 | 30 days supply | Qty: 30 | Fill #0

## 2020-05-03 NOTE — Progress Notes (Signed)
New Patient Office Visit  Subjective:  Patient ID: Jordan Martin, female    DOB: 21-Mar-1996  Age: 24 y.o. MRN: [id number]  CC:  Chief Complaint  Patient presents with  . Establish Care    Pt. is here to establish care.     HPI [name] overwork Jordan Martin is a 24 year old morbid obese female presents for establishment of care and the management of hypertension. Denies shortness of breath,  headaches, or chest pain . Mild ankle edema  Current Outpatient Medications on File Prior to Visit  Medication Sig Dispense Refill  . ibuprofen (ADVIL,MOTRIN) 200 MG tablet Take 200 mg by mouth every 6 (six) hours as needed for headache.    . fluticasone (FLONASE) 50 MCG/ACT nasal spray 2 spray each nostril qd (Patient not taking: Reported on 05/03/2020) 16 g 11  . OVER THE COUNTER MEDICATION Take 1 packet by mouth every 6 (six) hours as needed (For headache.). BC Aspirin Fast Pain Relief Powder Arthritis Formula (Patient not taking: Reported on 05/03/2020)     No current facility-administered medications on file prior to visit.    Past Medical History:  Diagnosis Date  . Allergy   . Hypertension   . Obesity     Past Surgical History:  Procedure Laterality Date  . ADENOIDECTOMY    . WISDOM TOOTH EXTRACTION      Family History  Problem Relation Age of Onset  . Hypertension Mother   . Heart attack Father   . Diabetes Father   . Hyperlipidemia Father   . Aneurysm Father 28       brain aneurysm  . Hypertension Father   . Hypertension Brother     Social History   Socioeconomic History  . Marital status: Single    Spouse name: Not on file  . Number of children: Not on file  . Years of education: Not on file  . Highest education level: Not on file  Occupational History  . Not on file  Tobacco Use  . Smoking status: Never Smoker  . Smokeless tobacco: Never Used  Vaping Use  . Vaping Use: Never used  Substance and Sexual Activity  . Alcohol use: No     Alcohol/week: 0.0 standard drinks  . Drug use: Yes    Types: Marijuana  . Sexual activity: Never    Birth control/protection: Abstinence  Other Topics Concern  . Not on file  Social History Narrative   36 to [redacted] weeks gestation 8 lbs. 3 oz. Delivered by C-section.   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   No pets .   [employer] came   Home after first semester  Social issues  3.0 average plans to go back to school   Social Determinants of Health   Financial Resource Strain:   . Difficulty of Paying Living Expenses: Not on file  Food Insecurity:   . Worried About Charity fundraiser in the Last Year: Not on file  . Ran Out of Food in the Last Year: Not on file  Transportation Needs:   . Lack of Transportation (Medical): Not on file  . Lack of Transportation (  Non-Medical): Not on file  Physical Activity:   . Days of Exercise per Week: Not on file  . Minutes of Exercise per Session: Not on file  Stress:   . Feeling of Stress : Not on file  Social Connections:   . Frequency of Communication with Friends and Family: Not on file  . Frequency of Social Gatherings with Friends and Family: Not on file  . Attends Religious Services: Not on file  . Active Member of Clubs or Organizations: Not on file  . Attends Archivist Meetings: Not on file  . Marital Status: Not on file  Intimate Partner Violence:   . Fear of Current or Ex-Partner: Not on file  . Emotionally Abused: Not on file  . Physically Abused: Not on file  . Sexually Abused: Not on file    ROS Review of Systems  Genitourinary: Positive for vaginal discharge.  Neurological: Positive for headaches.       Every since her father died at the age of 24  Psychiatric/Behavioral: The  patient is nervous/anxious.   All other systems reviewed and are negative.   Objective:   Today's Vitals: BP (!) 156/91 (BP Location: Left Arm, Patient Position: Sitting, Cuff Size: Large)   Pulse 81   Temp 97.7 F (36.5 C) (Temporal)   Ht 5' 4.75" (1.645 m)   Wt 264 lb (119.7 kg)   LMP 04/19/2020   SpO2 98%   BMI 44.27 kg/m   Physical Exam Vitals reviewed.  Constitutional:      Appearance: She is obese.  HENT:     Right Ear: Tympanic membrane normal.     Left Ear: Tympanic membrane normal.     Nose: Nose normal.  Eyes:     Extraocular Movements: Extraocular movements intact.     Pupils: Pupils are equal, round, and reactive to light.  Cardiovascular:     Rate and Rhythm: Normal rate and regular rhythm.  Pulmonary:     Effort: Pulmonary effort is normal.     Breath sounds: Normal breath sounds.  Abdominal:     General: Bowel sounds are normal. There is distension.     Palpations: Abdomen is soft.  Musculoskeletal:        General: Normal range of motion.     Cervical back: Normal range of motion and neck supple.  Skin:    General: Skin is warm and dry.  Neurological:     Mental Status: She is alert and oriented to person, place, and time.  Psychiatric:        Mood and Affect: Mood normal.        Behavior: Behavior normal.        Thought Content: Thought content normal.        Judgment: Judgment normal.     Assessment & Plan:  Jordan Martin was seen today for establish care.  Diagnoses and all orders for this visit: Jordan Martin was seen today for establish care..  Diagnoses and all orders for this visit:  Need for hepatitis C screening test Screening, preventive care, health maintenance, care gaps -     Hepatitis C Antibody  Screening for STD (sexually transmitted disease) No sexually active at this time previously and birthcontrol and STI reduction uses condoms. -     Cervicovaginal ancillary only  Hypertension, unspecified type Counseled on blood pressure goal of  less than 130/80, low-sodium, DASH diet, medication compliance, 150 minutes of moderate intensity exercise per week. Discussed medication compliance, adverse effects. -  CBC with Differential -     CMP14+EGFR  Class 3 severe obesity with body mass index (BMI) of 40.0 to 44.9 in adult, unspecified obesity type, unspecified whether serious comorbidity present (HCC) Morbid Obesity is Body mass index is 44.27 kg/m. > 40  indicating an excess in caloric intake or underlining conditions. This may lead to other co-morbidities.  Examples are respiratory problems, diabetes and cardiovascular disease lifestyle modifications of diet and exercise may reduce obesity.  -     Lipid Panel  Encounter to establish care Juluis Mire, NP-C will be your  (PCP) she is mastered prepared . She is skilled to diagnosed and treat illness. Also able to answer health concern as well as continuing care of varied medical conditions, not limited by cause, organ system, or diagnosis.   Screening for diabetes mellitus -     Glucose (CBG) -     HgB A1c 5.1  Per ADA guidelines A1c 5.1 represents no diabetes  Other orders -     amLODipine (NORVASC) 10 MG tablet; Take 1 tablet (10 mg total) by mouth daily. -     hydrochlorothiazide (HYDRODIURIL) 25 MG tablet; Take 1 tablet (25 mg total) by mouth daily.      Outpatient Encounter Medications as of 05/03/2020  Medication Sig  . ibuprofen (ADVIL,MOTRIN) 200 MG tablet Take 200 mg by mouth every 6 (six) hours as needed for headache.  . [DISCONTINUED] triamterene-hydrochlorothiazide (MAXZIDE-25) 37.5-25 MG tablet Take 1 tablet by mouth daily. For high blood pressure  . amLODipine (NORVASC) 10 MG tablet Take 1 tablet (10 mg total) by mouth daily.  . fluticasone (FLONASE) 50 MCG/ACT nasal spray 2 spray each nostril qd (Patient not taking: Reported on 05/03/2020)  . hydrochlorothiazide (HYDRODIURIL) 25 MG tablet Take 1 tablet (25 mg total) by mouth daily.  Marland Kitchen OVER THE  COUNTER MEDICATION Take 1 packet by mouth every 6 (six) hours as needed (For headache.). BC Aspirin Fast Pain Relief Powder Arthritis Formula (Patient not taking: Reported on 05/03/2020)   No facility-administered encounter medications on file as of 05/03/2020.    Follow-up: Return in about 6 weeks (around 06/14/2020) for BP ck and pap.   Kerin Perna, NP

## 2020-05-03 NOTE — Patient Instructions (Signed)

## 2020-05-04 LAB — LIPID PANEL
Chol/HDL Ratio: 2.8 ratio (ref 0.0–4.4)
Cholesterol, Total: 153 mg/dL (ref 100–199)
HDL: 54 mg/dL (ref >39)
LDL Chol Calc (NIH): 89 mg/dL (ref 0–99)
Triglycerides: 43 mg/dL (ref 0–149)
VLDL Cholesterol Cal: 10 mg/dL (ref 5–40)

## 2020-05-04 LAB — HEPATITIS C ANTIBODY: Hep C Virus Ab: <0.1 s/co ratio (ref 0.0–0.9)

## 2020-05-04 LAB — CMP14+EGFR
ALT: 19 IU/L (ref 0–32)
AST: 23 IU/L (ref 0–40)
Albumin/Globulin Ratio: 1.8 (ref 1.2–2.2)
Albumin: 4.4 g/dL (ref 3.9–5.0)
Alkaline Phosphatase: 50 IU/L (ref 44–121)
BUN/Creatinine Ratio: 11 (ref 9–23)
BUN: 9 mg/dL (ref 6–20)
Bilirubin Total: 0.2 mg/dL (ref 0.0–1.2)
CO2: 25 mmol/L (ref 20–29)
Calcium: 9.8 mg/dL (ref 8.7–10.2)
Chloride: 104 mmol/L (ref 96–106)
Creatinine, Ser: 0.85 mg/dL (ref 0.57–1.00)
GFR calc Af Amer: 112 mL/min/{1.73_m2} (ref >59)
GFR calc non Af Amer: 97 mL/min/{1.73_m2} (ref >59)
Globulin, Total: 2.5 g/dL (ref 1.5–4.5)
Glucose: 94 mg/dL (ref 65–99)
Potassium: 4.4 mmol/L (ref 3.5–5.2)
Sodium: 140 mmol/L (ref 134–144)
Total Protein: 6.9 g/dL (ref 6.0–8.5)

## 2020-05-04 LAB — CBC WITH DIFFERENTIAL/PLATELET
Basophils Absolute: 0 10*3/uL (ref 0.0–0.2)
Basos: 1 %
EOS (ABSOLUTE): 0.1 10*3/uL (ref 0.0–0.4)
Eos: 2 %
Hematocrit: 36.2 % (ref 34.0–46.6)
Hemoglobin: 11.1 g/dL (ref 11.1–15.9)
Immature Grans (Abs): 0 10*3/uL (ref 0.0–0.1)
Immature Granulocytes: 0 %
Lymphocytes Absolute: 2.1 10*3/uL (ref 0.7–3.1)
Lymphs: 35 %
MCH: 23.3 pg — ABNORMAL LOW (ref 26.6–33.0)
MCHC: 30.7 g/dL — ABNORMAL LOW (ref 31.5–35.7)
MCV: 76 fL — ABNORMAL LOW (ref 79–97)
Monocytes Absolute: 0.6 10*3/uL (ref 0.1–0.9)
Monocytes: 10 %
Neutrophils Absolute: 3.2 10*3/uL (ref 1.4–7.0)
Neutrophils: 52 %
Platelets: 374 10*3/uL (ref 150–450)
RBC: 4.76 x10E6/uL (ref 3.77–5.28)
RDW: 15.2 % (ref 11.7–15.4)
WBC: 6 10*3/uL (ref 3.4–10.8)

## 2020-05-06 ENCOUNTER — Telehealth (INDEPENDENT_AMBULATORY_CARE_PROVIDER_SITE_OTHER): Payer: Self-pay

## 2020-05-06 LAB — CERVICOVAGINAL ANCILLARY ONLY
Bacterial Vaginitis (gardnerella): NEGATIVE
Candida Glabrata: NEGATIVE
Candida Vaginitis: NEGATIVE
Chlamydia: NEGATIVE
Comment: NEGATIVE
Comment: NEGATIVE
Comment: NEGATIVE
Comment: NEGATIVE
Comment: NEGATIVE
Comment: NORMAL
Neisseria Gonorrhea: NEGATIVE
Trichomonas: NEGATIVE

## 2020-05-06 NOTE — Telephone Encounter (Signed)
Patient verified date of birth. She is aware that all labs are normal. Maryjean Morn, CMA

## 2020-05-06 NOTE — Telephone Encounter (Signed)
-----   Message from [name], NP sent at 05/06/2020  3:32 PM EDT ----- All labs were normal including STD screening

## 2020-06-13 ENCOUNTER — Encounter (INDEPENDENT_AMBULATORY_CARE_PROVIDER_SITE_OTHER): Payer: Self-pay | Admitting: Primary Care

## 2020-06-13 ENCOUNTER — Other Ambulatory Visit: Payer: Self-pay

## 2020-06-13 ENCOUNTER — Ambulatory Visit (INDEPENDENT_AMBULATORY_CARE_PROVIDER_SITE_OTHER): Payer: Self-pay | Admitting: Primary Care

## 2020-06-13 ENCOUNTER — Other Ambulatory Visit (INDEPENDENT_AMBULATORY_CARE_PROVIDER_SITE_OTHER): Payer: Self-pay | Admitting: Primary Care

## 2020-06-13 ENCOUNTER — Other Ambulatory Visit (HOSPITAL_COMMUNITY)
Admission: RE | Admit: 2020-06-13 | Discharge: 2020-06-13 | Disposition: A | Discharge status: Home / Self Care | Payer: Self-pay | Source: Ambulatory Visit | Attending: Primary Care | Admitting: Primary Care

## 2020-06-13 VITALS — BP 143/87 | HR 67 | Temp 98.2°F | Ht 64.75 in | Wt 263.6 lb

## 2020-06-13 DIAGNOSIS — Z113 Encounter for screening for infections with a predominantly sexual mode of transmission: Secondary | ICD-10-CM

## 2020-06-13 DIAGNOSIS — Z23 Encounter for immunization: Secondary | ICD-10-CM

## 2020-06-13 DIAGNOSIS — Z124 Encounter for screening for malignant neoplasm of cervix: Secondary | ICD-10-CM | POA: Insufficient documentation

## 2020-06-13 DIAGNOSIS — Z0000 Encounter for general adult medical examination without abnormal findings: Secondary | ICD-10-CM

## 2020-06-13 NOTE — Telephone Encounter (Signed)
Medication: amLODipine (NORVASC) 10 MG tablet [[id number]] , hydrochlorothiazide (HYDRODIURIL) 25 MG tablet [[id number]]   Has the patient contacted their pharmacy? YES  (Agent: If no, request that the patient contact the pharmacy for the refill.) (Agent: If yes, when and what did the pharmacy advise  Preferred Pharmacy (with phone number or street name): [facility] - [place], [place] - [address]  [address], [place] [address]  Phone:  [phone] Fax:  [phone]   Agent: Please be advised that RX refills may take up to 3 business days. We ask that you follow-up with your pharmacy.

## 2020-06-14 LAB — CERVICOVAGINAL ANCILLARY ONLY
Bacterial Vaginitis (gardnerella): NEGATIVE
Candida Glabrata: NEGATIVE
Candida Vaginitis: POSITIVE — AB
Chlamydia: NEGATIVE
Comment: NEGATIVE
Comment: NEGATIVE
Comment: NEGATIVE
Comment: NEGATIVE
Comment: NEGATIVE
Comment: NORMAL
Neisseria Gonorrhea: NEGATIVE
Trichomonas: NEGATIVE

## 2020-06-14 MED FILL — AMLODIPINE BESYLATE 10 MG T: 10 | 30 days supply | Qty: 30 | Fill #1

## 2020-06-14 MED FILL — HYDROCHLOROTHIAZIDE 25 MG T: 25 | 30 days supply | Qty: 30 | Fill #1

## 2020-06-17 LAB — CYTOLOGY - PAP: Diagnosis: NEGATIVE

## 2020-06-18 ENCOUNTER — Other Ambulatory Visit (INDEPENDENT_AMBULATORY_CARE_PROVIDER_SITE_OTHER): Payer: Self-pay | Admitting: Primary Care

## 2020-06-18 DIAGNOSIS — B3731 Acute candidiasis of vulva and vagina: Secondary | ICD-10-CM

## 2020-06-18 DIAGNOSIS — B373 Candidiasis of vulva and vagina: Secondary | ICD-10-CM

## 2020-06-18 MED ORDER — FLUCONAZOLE 150 MG PO TABS: 150.0000 mg | ORAL_TABLET | Freq: Once | ORAL | 0 refills | Status: AC

## 2020-06-18 NOTE — Progress Notes (Signed)
Established Patient Office Visit  Subjective:  Patient ID: Jordan Martin, female    DOB: 1996-06-06  Age: 24 y.o. MRN: [id number]  CC:  Chief Complaint  Patient presents with   Blood Pressure Check   Gynecologic Exam    HPI [name] is a 24 year old female who presents for blood pressure and gyn. Blood pressure elevated 143/87 admits to missing dosage of Bp . Denies shortness of breath, headaches, chest pain or lower extremity edema  Past Medical History:  Diagnosis Date   Allergy    Hypertension    Obesity     Past Surgical History:  Procedure Laterality Date   ADENOIDECTOMY     WISDOM TOOTH EXTRACTION      Family History  Problem Relation Age of Onset   Hypertension Mother    Heart attack Father    Diabetes Father    Hyperlipidemia Father    Aneurysm Father 40       brain aneurysm   Hypertension Father    Hypertension Brother     Social History   Socioeconomic History   Marital status: Single    Spouse name: Not on file   Number of children: Not on file   Years of education: Not on file   Highest education level: Not on file  Occupational History   Not on file  Tobacco Use   Smoking status: Never Smoker   Smokeless tobacco: Never Used  Vaping Use   Vaping Use: Never used  Substance and Sexual Activity   Alcohol use: No    Alcohol/week: 0.0 standard drinks   Drug use: Yes    Types: Marijuana   Sexual activity: Never    Birth control/protection: Abstinence  Other Topics Concern   Not on file  Social History Narrative   36 to [redacted] weeks gestation 8 lbs. 3 oz. Delivered by C-section.   No neonatal problems       [place]   Household of 5  with mom no DTS pet   Father died of sudden heart attack when he was in his 21s when Jordan Martin was younger  Age 77    Mom   [name] working full-time now Arts administrator   Two older siblings in good health one in the [employer].   No pets .   [employer] came   Home after first  semester  Social issues  3.0 average plans to go back to school   Social Determinants of Health   Financial Resource Strain: Not on file  Food Insecurity: Not on file  Transportation Needs: Not on file  Physical Activity: Not on file  Stress: Not on file  Social Connections: Not on file  Intimate Partner Violence: Not on file    Outpatient Medications Prior to Visit  Medication Sig Dispense Refill   amLODipine (NORVASC) 10 MG tablet Take 1 tablet (10 mg total) by mouth daily. 90 tablet 3   hydrochlorothiazide (HYDRODIURIL) 25 MG tablet Take 1 tablet (25 mg total) by mouth daily. 90 tablet 3   fluticasone (FLONASE) 50 MCG/ACT nasal spray 2 spray each nostril qd (Patient not taking: Reported on 05/03/2020) 16 g 11   ibuprofen (ADVIL,MOTRIN) 200 MG tablet Take 200 mg by mouth every 6 (six) hours as needed for headache.     OVER THE COUNTER MEDICATION Take 1 packet by mouth every 6 (six) hours as needed (For headache.). BC Aspirin Fast Pain Relief Powder Arthritis Formula (Patient not taking: Reported on 05/03/2020)  No facility-administered medications prior to visit.    No Known Allergies  ROS Review of Systems  All other systems reviewed and are negative.     Objective:   BP (!) 143/87 (BP Location: Right Arm, Patient Position: Sitting, Cuff Size: Large)    Pulse 67    Temp 98.2 F (36.8 C) (Temporal)    Ht 5' 4.75" (1.645 m)    Wt 263 lb 9.6 oz (119.6 kg)    LMP 05/18/2020 (Exact Date)    SpO2 98%    BMI 44.20 kg/m  Wt Readings from Last 3 Encounters:  06/13/20 263 lb 9.6 oz (119.6 kg)  05/03/20 264 lb (119.7 kg)  11/28/19 252 lb 6.4 oz (114.5 kg)   CONSTITUTIONAL: Well-developed, well-nourished morbid obese  female in no acute distress.  HENT:  Normocephalic, atraumatic, External right and left ear normal. EYES: Conjunctivae and EOM are normal. Pupils are equal, round, and reactive to light. No scleral icterus.  NECK: Normal range of motion, supple, no masses.   Normal thyroid.  SKIN: Skin is warm and dry. No rash noted. Not diaphoretic. No erythema. No pallor. NEUROLGIC: Alert and oriented to person, place, and time. Normal reflexes, muscle tone coordination. No cranial nerve deficit noted. PSYCHIATRIC: Normal mood and affect. Normal behavior. Normal judgment and thought content. CARDIOVASCULAR: Normal heart rate noted, regular rhythm RESPIRATORY: Clear to auscultation bilaterally. Effort and breath sounds normal, no problems with respiration noted. BREASTS: Taught SBE and  She did return demonstration ABDOMEN: Soft, normal bowel sounds, no distention noted.  No tenderness, rebound or guarding.  PELVIC: Normal appearing external genitalia; normal appearing vaginal mucosa and cervix.  No abnormal discharge noted.  Pap smear obtained.  Normal uterine size, no other palpable masses, no uterine or adnexal tenderness. MUSCULOSKELETAL: Normal range of motion. No tenderness.  No cyanosis, clubbing, or edema.  2+ distal pulses.  Health Maintenance Due  Topic Date Due   COVID-19 Vaccine (1) Never done   TETANUS/TDAP  03/31/2017    There are no preventive care reminders to display for this patient.  Lab Results  Component Value Date   TSH 1.41 10/08/2014   Lab Results  Component Value Date   WBC 6.0 05/03/2020   HGB 11.1 05/03/2020   HCT 36.2 05/03/2020   MCV 76 (L) 05/03/2020   PLT 374 05/03/2020   Lab Results  Component Value Date   NA 140 05/03/2020   K 4.4 05/03/2020   CO2 25 05/03/2020   GLUCOSE 94 05/03/2020   BUN 9 05/03/2020   CREATININE 0.85 05/03/2020   BILITOT 0.2 05/03/2020   ALKPHOS 50 05/03/2020   AST 23 05/03/2020   ALT 19 05/03/2020   PROT 6.9 05/03/2020   ALBUMIN 4.4 05/03/2020   CALCIUM 9.8 05/03/2020   ANIONGAP 9 08/30/2017   GFR 118.99 09/24/2015   Lab Results  Component Value Date   CHOL 153 05/03/2020   Lab Results  Component Value Date   HDL 54 05/03/2020   Lab Results  Component Value Date   LDLCALC  89 05/03/2020   Lab Results  Component Value Date   TRIG 43 05/03/2020   Lab Results  Component Value Date   CHOLHDL 2.8 05/03/2020   Lab Results  Component Value Date   HGBA1C 5.1 05/03/2020      Assessment & Plan:   Problem List Items Addressed This Visit    None  Jordan Martin was seen today for blood pressure check and gynecologic exam.  Diagnoses and all orders  for this visit:  Need for Tdap vaccination -     Tdap vaccine greater than or equal to 7yo IM  Cervical cancer screening -     Cytology - PAP([facility]) -  Screening for STD (sexually transmitted disease) -     Cervicovaginal ancillary only       No orders of the defined types were placed in this encounter.   Follow-up: No follow-ups on file.    [name], NP

## 2020-06-19 MED FILL — FLUCONAZOLE 150 MG TABLET: 150 | 2 days supply | Qty: 2 | Fill #0

## 2020-07-03 ENCOUNTER — Encounter (INDEPENDENT_AMBULATORY_CARE_PROVIDER_SITE_OTHER): Payer: Self-pay | Admitting: Primary Care

## 2020-07-19 ENCOUNTER — Other Ambulatory Visit (INDEPENDENT_AMBULATORY_CARE_PROVIDER_SITE_OTHER): Payer: Self-pay | Admitting: Primary Care

## 2020-07-19 MED FILL — HYDROCHLOROTHIAZIDE 25 MG T: 25 | 30 days supply | Qty: 30 | Fill #2

## 2020-07-19 MED FILL — AMLODIPINE BESYLATE 10 MG T: 10 | 30 days supply | Qty: 30 | Fill #2

## 2020-07-19 NOTE — Telephone Encounter (Signed)
Medication: hydrochlorothiazide (HYDRODIURIL) 25 MG tablet [[id number]] , amLODipine (NORVASC) 10 MG tablet [[id number]]   Has the patient contacted their pharmacy? YES  (Agent: If no, request that the patient contact the pharmacy for the refill.) (Agent: If yes, when and what did the pharmacy advise?)  Preferred Pharmacy (with phone number or street name): [facility] - [place], [place] - [address]  [address], [place] [address]  Phone:  [phone] Fax:  [phone]   Agent: Please be advised that RX refills may take up to 3 business days. We ask that you follow-up with your pharmacy.

## 2020-08-23 MED FILL — HYDROCHLOROTHIAZIDE 25 MG T: 25 | 60 days supply | Qty: 60 | Fill #3

## 2020-08-23 MED FILL — AMLODIPINE BESYLATE 10 MG T: 10 | 60 days supply | Qty: 60 | Fill #3

## 2020-09-24 ENCOUNTER — Encounter (INDEPENDENT_AMBULATORY_CARE_PROVIDER_SITE_OTHER): Payer: Self-pay | Admitting: Primary Care

## 2020-09-24 ENCOUNTER — Other Ambulatory Visit: Payer: Self-pay

## 2020-09-24 ENCOUNTER — Ambulatory Visit (INDEPENDENT_AMBULATORY_CARE_PROVIDER_SITE_OTHER): Payer: Self-pay | Admitting: Primary Care

## 2020-09-24 VITALS — BP 138/78 | HR 100 | Temp 97.7°F | Ht 64.75 in | Wt 260.4 lb

## 2020-09-24 DIAGNOSIS — Z23 Encounter for immunization: Secondary | ICD-10-CM

## 2020-09-24 DIAGNOSIS — I10 Essential (primary) hypertension: Secondary | ICD-10-CM

## 2020-09-24 DIAGNOSIS — Z113 Encounter for screening for infections with a predominantly sexual mode of transmission: Secondary | ICD-10-CM

## 2020-09-24 DIAGNOSIS — Z7251 High risk heterosexual behavior: Secondary | ICD-10-CM

## 2020-09-24 NOTE — Progress Notes (Signed)
Established Patient Office Visit  Subjective:  Patient ID: Jordan Martin, female    DOB: 1996-06-15  Age: 25 y.o. MRN: [id number]  CC:  Chief Complaint  Patient presents with  . STD testing    HPI [name] is a 25 year old morbid female she presents for STD testing just got out of a relationship. She states that she has been taking her Bp every day and feeling a lot better. Next concern is if she is pregnant cycle are regular symptoms nausea , fatigues and GERD.   Past Medical History:  Diagnosis Date  . Allergy   . Hypertension   . Obesity     Past Surgical History:  Procedure Laterality Date  . ADENOIDECTOMY    . WISDOM TOOTH EXTRACTION      Family History  Problem Relation Age of Onset  . Hypertension Mother   . Heart attack Father   . Diabetes Father   . Hyperlipidemia Father   . Aneurysm Father 40       brain aneurysm  . Hypertension Father   . Hypertension Brother     Social History   Socioeconomic History  . Marital status: Single    Spouse name: Not on file  . Number of children: Not on file  . Years of education: Not on file  . Highest education level: Not on file  Occupational History  . Not on file  Tobacco Use  . Smoking status: Never Smoker  . Smokeless tobacco: Never Used  Vaping Use  . Vaping Use: Never used  Substance and Sexual Activity  . Alcohol use: No    Alcohol/week: 0.0 standard drinks  . Drug use: Yes    Types: Marijuana  . Sexual activity: Never    Birth control/protection: Abstinence  Other Topics Concern  . Not on file  Social History Narrative   36 to [redacted] weeks gestation 8 lbs. 3 oz. Delivered by C-section.   No neonatal problems       [place]   Household of 5  with mom no DTS pet   Father died of sudden heart attack when he was in his 74s when Jordan Martin was younger  Age 58    Mom   Jordan Martin working full-time now Arts administrator   Two older siblings in good health one in the [employer].   No pets .   [employer]  came   Home after first semester  Social issues  3.0 average plans to go back to school   Social Determinants of Health   Financial Resource Strain: Not on file  Food Insecurity: Not on file  Transportation Needs: Not on file  Physical Activity: Not on file  Stress: Not on file  Social Connections: Not on file  Intimate Partner Violence: Not on file    Outpatient Medications Prior to Visit  Medication Sig Dispense Refill  . amLODipine (NORVASC) 10 MG tablet Take 1 tablet (10 mg total) by mouth daily. 90 tablet 3  . hydrochlorothiazide (HYDRODIURIL) 25 MG tablet Take 1 tablet (25 mg total) by mouth daily. 90 tablet 3  . fluticasone (FLONASE) 50 MCG/ACT nasal spray 2 spray each nostril qd (Patient not taking: No sig reported) 16 g 11   No facility-administered medications prior to visit.    No Known Allergies  ROS Review of Systems  Constitutional: Positive for fatigue.  Gastrointestinal: Positive for nausea.       Acid reflex  All other systems reviewed and are negative.  Objective:    Physical Exam Vitals reviewed.  Constitutional:      Appearance: She is obese.  HENT:     Head: Normocephalic.     Right Ear: External ear normal.     Left Ear: External ear normal.     Nose: Nose normal.  Eyes:     Extraocular Movements: Extraocular movements intact.  Cardiovascular:     Rate and Rhythm: Normal rate and regular rhythm.  Pulmonary:     Effort: Pulmonary effort is normal.     Breath sounds: Normal breath sounds.  Abdominal:     General: Bowel sounds are normal. There is distension.     Palpations: Abdomen is soft.  Musculoskeletal:        General: Normal range of motion.     Cervical back: Normal range of motion and neck supple.  Skin:    General: Skin is warm and dry.  Neurological:     Mental Status: She is alert and oriented to person, place, and time.  Psychiatric:        Mood and Affect: Mood normal.        Behavior: Behavior normal.        Thought  Content: Thought content normal.        Judgment: Judgment normal.     BP 138/78 (BP Location: Left Arm, Patient Position: Sitting, Cuff Size: Large)   Pulse 100   Temp 97.7 F (36.5 C) (Temporal)   Ht 5' 4.75" (1.645 m)   Wt 260 lb 6.4 oz (118.1 kg)   LMP 09/08/2020 (Exact Date)   SpO2 98%   BMI 43.67 kg/m  Wt Readings from Last 3 Encounters:  09/24/20 260 lb 6.4 oz (118.1 kg)  06/13/20 263 lb 9.6 oz (119.6 kg)  05/03/20 264 lb (119.7 kg)     Health Maintenance Due  Topic Date Due  . COVID-19 Vaccine (3 - Booster for Moderna series) 09/26/2020    There are no preventive care reminders to display for this patient.  Lab Results  Component Value Date   TSH 1.41 10/08/2014   Lab Results  Component Value Date   WBC 6.0 05/03/2020   HGB 11.1 05/03/2020   HCT 36.2 05/03/2020   MCV 76 (L) 05/03/2020   PLT 374 05/03/2020   Lab Results  Component Value Date   NA 140 05/03/2020   K 4.4 05/03/2020   CO2 25 05/03/2020   GLUCOSE 94 05/03/2020   BUN 9 05/03/2020   CREATININE 0.85 05/03/2020   BILITOT 0.2 05/03/2020   ALKPHOS 50 05/03/2020   AST 23 05/03/2020   ALT 19 05/03/2020   PROT 6.9 05/03/2020   ALBUMIN 4.4 05/03/2020   CALCIUM 9.8 05/03/2020   ANIONGAP 9 08/30/2017   GFR 118.99 09/24/2015   Lab Results  Component Value Date   CHOL 153 05/03/2020   Lab Results  Component Value Date   HDL 54 05/03/2020   Lab Results  Component Value Date   LDLCALC 89 05/03/2020   Lab Results  Component Value Date   TRIG 43 05/03/2020   Lab Results  Component Value Date   CHOLHDL 2.8 05/03/2020   Lab Results  Component Value Date   HGBA1C 5.1 05/03/2020      Assessment & Plan:  Estelita was seen today for std testing.  Diagnoses and all orders for this visit:  Need for Tdap vaccination -     Tdap vaccine greater than or equal to 7yo IM  Screening for STD (  sexually transmitted disease) -     HIV antibody (with reflex)  Unprotected sexual  intercourse With s/s of nausea , acid reflex and fatigue  -     HIV antibody (with reflex) -     Beta hCG quant (ref lab)  Hypertension, unspecified type Counseled on blood pressure goal of less than 130/80, low-sodium, DASH diet, medication compliance, 150 minutes of moderate intensity exercise per week.  Discussed the risk factors associated with elevated blood pressure risk for stroke heart attack, fast food eating out, not exercising, obese, importance of taking amlodipine 10 mg and hydrochlorothiazide 25 mg daily to better control blood pressure. Discussed medication compliance, adverse effects.   No orders of the defined types were placed in this encounter.   Follow-up: Return in about 3 months (around 12/25/2020) for Bp f/u.    [name], NP

## 2020-09-24 NOTE — Progress Notes (Signed)
Pt wants STD/HIV testing

## 2020-09-25 LAB — HIV ANTIBODY (ROUTINE TESTING W REFLEX): HIV Screen 4th Generation wRfx: NONREACTIVE

## 2020-12-25 ENCOUNTER — Ambulatory Visit (INDEPENDENT_AMBULATORY_CARE_PROVIDER_SITE_OTHER): Payer: Self-pay | Admitting: Primary Care

## 2021-03-11 ENCOUNTER — Other Ambulatory Visit: Payer: Self-pay

## 2021-03-11 MED FILL — Hydrochlorothiazide Tab 25 MG: ORAL | 30 days supply | Qty: 30 | Fill #0 | Status: AC

## 2021-03-11 MED FILL — Amlodipine Besylate Tab 10 MG (Base Equivalent): ORAL | 30 days supply | Qty: 30 | Fill #0 | Status: AC

## 2021-03-12 ENCOUNTER — Other Ambulatory Visit: Payer: Self-pay

## 2021-04-29 ENCOUNTER — Other Ambulatory Visit: Payer: Self-pay

## 2021-04-29 MED FILL — Amlodipine Besylate Tab 10 MG (Base Equivalent): ORAL | 30 days supply | Qty: 30 | Fill #1 | Status: AC

## 2021-04-29 MED FILL — Hydrochlorothiazide Tab 25 MG: ORAL | 30 days supply | Qty: 30 | Fill #1 | Status: AC

## 2021-05-07 ENCOUNTER — Other Ambulatory Visit: Payer: Self-pay

## 2021-07-15 ENCOUNTER — Other Ambulatory Visit (INDEPENDENT_AMBULATORY_CARE_PROVIDER_SITE_OTHER): Payer: Self-pay | Admitting: Primary Care

## 2021-07-15 ENCOUNTER — Other Ambulatory Visit: Payer: Self-pay

## 2021-07-21 ENCOUNTER — Ambulatory Visit (INDEPENDENT_AMBULATORY_CARE_PROVIDER_SITE_OTHER): Payer: Self-pay

## 2021-07-21 ENCOUNTER — Other Ambulatory Visit: Payer: Self-pay

## 2021-07-21 DIAGNOSIS — Z111 Encounter for screening for respiratory tuberculosis: Secondary | ICD-10-CM

## 2021-07-22 ENCOUNTER — Ambulatory Visit (INDEPENDENT_AMBULATORY_CARE_PROVIDER_SITE_OTHER): Payer: Self-pay | Admitting: Primary Care

## 2021-07-22 ENCOUNTER — Other Ambulatory Visit: Payer: Self-pay

## 2021-07-22 ENCOUNTER — Encounter (INDEPENDENT_AMBULATORY_CARE_PROVIDER_SITE_OTHER): Payer: Self-pay | Admitting: Primary Care

## 2021-07-22 VITALS — BP 135/83 | HR 81 | Temp 97.5°F | Ht 65.0 in | Wt 266.6 lb

## 2021-07-22 DIAGNOSIS — Z6841 Body Mass Index (BMI) 40.0 and over, adult: Secondary | ICD-10-CM

## 2021-07-22 DIAGNOSIS — Z760 Encounter for issue of repeat prescription: Secondary | ICD-10-CM

## 2021-07-22 DIAGNOSIS — I10 Essential (primary) hypertension: Secondary | ICD-10-CM

## 2021-07-22 DIAGNOSIS — Z0001 Encounter for general adult medical examination with abnormal findings: Secondary | ICD-10-CM

## 2021-07-22 DIAGNOSIS — Z131 Encounter for screening for diabetes mellitus: Secondary | ICD-10-CM

## 2021-07-22 DIAGNOSIS — R7303 Prediabetes: Secondary | ICD-10-CM

## 2021-07-22 DIAGNOSIS — Z0000 Encounter for general adult medical examination without abnormal findings: Secondary | ICD-10-CM

## 2021-07-22 LAB — POCT GLYCOSYLATED HEMOGLOBIN (HGB A1C): Hemoglobin A1C: 5.7 % — AB (ref 4.0–5.6)

## 2021-07-22 MED ORDER — HYDROCHLOROTHIAZIDE 25 MG PO TABS
ORAL_TABLET | Freq: Every day | ORAL | 3 refills | Status: DC
Start: 2021-07-22 — End: 2022-10-05
  Filled 2021-07-22 – 2021-08-05 (×2): qty 30, 30d supply, fill #0
  Filled 2021-12-11: qty 30, 30d supply, fill #1
  Filled 2022-03-05: qty 30, 30d supply, fill #2
  Filled 2022-05-13: qty 30, 30d supply, fill #3
  Filled 2022-07-08 – 2022-07-15 (×2): qty 30, 30d supply, fill #4

## 2021-07-22 MED ORDER — AMLODIPINE BESYLATE 10 MG PO TABS
ORAL_TABLET | Freq: Every day | ORAL | 3 refills | Status: DC
  Filled 2021-07-22 – 2021-08-05 (×2): qty 30, 30d supply, fill #0
  Filled 2021-12-11: qty 30, 30d supply, fill #1
  Filled 2022-03-05: qty 30, 30d supply, fill #2
  Filled 2022-05-13: qty 30, 30d supply, fill #3
  Filled 2022-07-08 – 2022-07-15 (×2): qty 30, 30d supply, fill #4

## 2021-07-22 NOTE — Progress Notes (Signed)
Jordan Martin is a 26 y.o. female presents to office today for annual physical exam examination.    Concerns today include: 1. None  Occupation: daycare, Marital status: S, Substance use: N Diet: yes , Exercise: yes  Last eye exam: 2022 Last dental exam: 5/22 Last pap smear: 06/13/20 Refills needed today: Yes Immunizations needed: Flu Vaccine: refused  Tdap Vaccine: yes  - every 63yrs - (<3 lifetime doses or unknown): all wounds -- look up need for Tetanus IG - (>=3 lifetime doses): clean/minor wound if >73yrs from previous; all other wounds if >68yrs from previous Zoster Vaccine: no (those >50yo, once) Pneumonia Vaccine: no (those w/ risk factors) - (<51yr) Both: Immunocompromised, cochlear implant, CSF leak, asplenic, sickle cell, Chronic Renal Failure - (<35yr) PPSV-23 only: Heart dz, lung disease, DM, tobacco abuse, alcoholism, cirrhosis/liver disease. - (>33yr): PPSV13 then PPSV23 in 6-12mths;  - (>86yr): repeat PPSV23 once if pt received prior to 25yo and 4yrs have passed  Past Medical History:  Diagnosis Date   Allergy    Hypertension    Obesity    Social History   Socioeconomic History   Marital status: Single    Spouse name: Not on file   Number of children: Not on file   Years of education: Not on file   Highest education level: Not on file  Occupational History   Not on file  Tobacco Use   Smoking status: Never   Smokeless tobacco: Never  Vaping Use   Vaping Use: Never used  Substance and Sexual Activity   Alcohol use: No    Alcohol/week: 0.0 standard drinks   Drug use: Yes    Types: Marijuana   Sexual activity: Never    Birth control/protection: Abstinence  Other Topics Concern   Not on file  Social History Narrative   36 to [redacted] weeks gestation 8 lbs. 3 oz. Delivered by C-section.   No neonatal problems       [place]   Household of 5  with mom no DTS pet   Father died of sudden heart attack when he was in his 72s when Jordan Martin was younger  Age  63    Mom   Jordan Martin working full-time now Arts administrator   Two older siblings in good health one in the [employer].   No pets .   [employer] came   Home after first semester  Social issues  3.0 average plans to go back to school   Social Determinants of Health   Financial Resource Strain: Not on file  Food Insecurity: Not on file  Transportation Needs: Not on file  Physical Activity: Not on file  Stress: Not on file  Social Connections: Not on file  Intimate Partner Violence: Not on file   Past Surgical History:  Procedure Laterality Date   ADENOIDECTOMY     WISDOM TOOTH EXTRACTION     Family History  Problem Relation Age of Onset   Hypertension Mother    Heart attack Father    Diabetes Father    Hyperlipidemia Father    Aneurysm Father 40       brain aneurysm   Hypertension Father    Hypertension Brother     Current Outpatient Medications:    amLODipine (NORVASC) 10 MG tablet, TAKE 1 TABLET (10 MG TOTAL) BY MOUTH DAILY., Disp: 90 tablet, Rfl: 3   fluticasone (FLONASE) 50 MCG/ACT nasal spray, 2 spray each nostril qd (Patient not taking: No sig reported), Disp: 16 g, Rfl: 11  hydrochlorothiazide (HYDRODIURIL) 25 MG tablet, TAKE 1 TABLET (25 MG TOTAL) BY MOUTH DAILY., Disp: 90 tablet, Rfl: 3  No Known Allergies   ROS: Review of Systems A comprehensive review of systems was negative.    Physical exam BP 135/83 (BP Location: Right Arm, Patient Position: Sitting, Cuff Size: Large)    Pulse 81    Temp (!) 97.5 F (36.4 C) (Temporal)    Ht 5\' 5"  (1.651 m)    Wt 266 lb 9.6 oz (120.9 kg)    LMP 06/24/2021 (Exact Date)    SpO2 95%    BMI 44.36 kg/m  General appearance: alert, cooperative, appears older than stated age, and morbidly obese Head: Normocephalic, without obvious abnormality, atraumatic Ears: normal TM's and external ear canals both ears Nose: Nares normal. Septum midline. Mucosa normal. No drainage or sinus tenderness. Neck: no adenopathy, no carotid bruit, no JVD,  supple, symmetrical, trachea midline, and thyroid not enlarged, symmetric, no tenderness/mass/nodules Lungs: clear to auscultation bilaterally Heart: regular rate and rhythm, S1, S2 normal, no murmur, click, rub or gallop Abdomen: soft, non-tender; bowel sounds normal; no masses,  no organomegaly Extremities: extremities normal, atraumatic, no cyanosis or edema Pulses: 2+ and symmetric Skin: Skin color, texture, turgor normal. No rashes or lesions Lymph nodes: Cervical, supraclavicular, and axillary nodes normal. Neurologic: Alert and oriented X 3, normal strength and tone. Normal symmetric reflexes. Normal coordination and gait    Assessment/ Plan: 06/26/2021 here for annual physical exam.  [name] was seen today for annual exam and blood pressure check.  Diagnoses and all orders for this visit:  Screening for diabetes mellitus -     HgB A1c 5.7  Prediabetes Prediabetes is 5.7-6.4 discussed dx pre and 6.7> diabetes . Monitor foods that are high in carbohydrates are the following rice, potatoes, breads, sugars, and pastas.  Reduction in the intake (eating) will assist in lowering your blood sugars.   Annual physical exam Completed   Morbid obesity (HCC) Morbid Obesity is >40 indicating an excess in caloric intake or underlining conditions. This may lead to other co-morbidities. Lifestyle modifications of diet and exercise may reduce obesity.  She has already started implementing exercising . Santa brought her oculus which she uses daily   Medication refill -     amLODipine (NORVASC) 10 MG tablet; TAKE 1 TABLET (10 MG TOTAL) BY MOUTH DAILY. -     hydrochlorothiazide (HYDRODIURIL) 25 MG tablet; TAKE 1 TABLET (25 MG TOTAL) BY MOUTH DAILY.    Counseled on healthy lifestyle choices, including diet (rich in fruits, vegetables and lean meats and low in salt and simple carbohydrates) and exercise (at least 30 minutes of moderate physical activity daily).  Patient to follow up in 1  year for annual exam or sooner if needed.  The above assessment and management plan was discussed with the patient. The patient verbalized understanding of and has agreed to the management plan. Patient is aware to call the clinic if symptoms persist or worsen. Patient is aware when to return to the clinic for a follow-up visit. Patient educated on when it is appropriate to go to the emergency department.   Dorathy Daft NP-C [address] [place] [phone]

## 2021-07-22 NOTE — Patient Instructions (Signed)
Your A1C is a measure of your sugar over the past 3 months and is not affected by what you have eaten over the past few days. Diabetes increases your chances of stroke and heart attack over 300 % and is the leading cause of blindness and kidney failure in the [place]. Please make sure you decrease bad carbs like white bread, white rice, potatoes, corn, soft drinks, pasta, cereals, refined sugars, sweet tea, dried fruits, and fruit juice. Good carbs are okay to eat in moderation like sweet potatoes, brown rice, whole grain pasta/bread, most fruit (except dried fruit) and you can eat as many veggies as you want.

## 2021-07-24 ENCOUNTER — Ambulatory Visit (INDEPENDENT_AMBULATORY_CARE_PROVIDER_SITE_OTHER): Payer: Self-pay

## 2021-07-25 ENCOUNTER — Ambulatory Visit (INDEPENDENT_AMBULATORY_CARE_PROVIDER_SITE_OTHER): Payer: Self-pay

## 2021-07-28 ENCOUNTER — Other Ambulatory Visit: Payer: Self-pay

## 2021-08-04 ENCOUNTER — Ambulatory Visit (INDEPENDENT_AMBULATORY_CARE_PROVIDER_SITE_OTHER): Payer: Self-pay

## 2021-08-05 ENCOUNTER — Other Ambulatory Visit: Payer: Self-pay

## 2021-09-17 ENCOUNTER — Ambulatory Visit (INDEPENDENT_AMBULATORY_CARE_PROVIDER_SITE_OTHER): Payer: Self-pay

## 2021-09-17 ENCOUNTER — Other Ambulatory Visit: Payer: Self-pay

## 2021-09-17 DIAGNOSIS — Z111 Encounter for screening for respiratory tuberculosis: Secondary | ICD-10-CM

## 2021-09-19 ENCOUNTER — Other Ambulatory Visit: Payer: Self-pay

## 2021-09-19 ENCOUNTER — Ambulatory Visit (INDEPENDENT_AMBULATORY_CARE_PROVIDER_SITE_OTHER): Payer: Self-pay

## 2021-09-19 DIAGNOSIS — Z111 Encounter for screening for respiratory tuberculosis: Secondary | ICD-10-CM

## 2021-09-19 LAB — TB SKIN TEST
Induration: 0 mm
TB Skin Test: NEGATIVE

## 2021-11-11 ENCOUNTER — Telehealth (INDEPENDENT_AMBULATORY_CARE_PROVIDER_SITE_OTHER): Payer: Self-pay | Admitting: Primary Care

## 2021-11-11 NOTE — Telephone Encounter (Signed)
Disregard

## 2021-12-11 ENCOUNTER — Other Ambulatory Visit: Payer: Self-pay

## 2021-12-11 ENCOUNTER — Other Ambulatory Visit (INDEPENDENT_AMBULATORY_CARE_PROVIDER_SITE_OTHER): Payer: Self-pay | Admitting: Primary Care

## 2021-12-11 DIAGNOSIS — Z760 Encounter for issue of repeat prescription: Secondary | ICD-10-CM

## 2021-12-11 DIAGNOSIS — I10 Essential (primary) hypertension: Secondary | ICD-10-CM

## 2021-12-11 MED ORDER — HYDROCHLOROTHIAZIDE 25 MG PO TABS
ORAL_TABLET | Freq: Every day | ORAL | 0 refills | Status: DC
  Filled 2022-01-14: qty 30, 30d supply, fill #0

## 2021-12-11 MED ORDER — AMLODIPINE BESYLATE 10 MG PO TABS
ORAL_TABLET | Freq: Every day | ORAL | 0 refills | Status: DC
  Filled 2022-01-14: qty 30, 30d supply, fill #0

## 2021-12-11 NOTE — Telephone Encounter (Signed)
Medication Refill - Medication: amLODipine (NORVASC) 10 MG tablet hydrochlorothiazide (HYDRODIURIL) 25 MG tablet  Has the patient contacted their pharmacy? Yes.   (Agent: If no, request that the patient contact the pharmacy for the refill. If patient does not wish to contact the pharmacy document the reason why and proceed with request.) (Agent: If yes, when and what did the pharmacy advise?)  Preferred Pharmacy (with phone number or street name):  [facility] [address], [place] [address]  Phone: [phone] Fax: [phone]   Has the patient been seen for an appointment in the last year OR does the patient have an upcoming appointment? Yes.    Agent: Please be advised that RX refills may take up to 3 business days. We ask that you follow-up with your pharmacy.

## 2021-12-11 NOTE — Telephone Encounter (Signed)
Rxs sent

## 2021-12-12 ENCOUNTER — Other Ambulatory Visit: Payer: Self-pay

## 2022-01-14 ENCOUNTER — Other Ambulatory Visit: Payer: Self-pay

## 2022-02-23 ENCOUNTER — Ambulatory Visit (INDEPENDENT_AMBULATORY_CARE_PROVIDER_SITE_OTHER): Payer: Self-pay | Admitting: *Deleted

## 2022-02-23 NOTE — Telephone Encounter (Signed)
  Chief Complaint: lump under right jaw  Symptoms: lump size of marble under right jaw and c/o chewing difficulty. S/p sinus infection 1-2 weeks ago  Frequency: 1 1/2 week ago  Pertinent Negatives: Patient denies fever. No redness no drainage. No itching no pain.  Disposition: [] ED /[] Urgent Care (no appt availability in office) / [] Appointment(In office/virtual)/ []  [facility]/ [] Home Care/ [] Refused Recommended Disposition /[x] [facility]/ []  Follow-up with PCP Additional Notes:   No available appt until 03/10/22. address for tomorrow. Please advise if appt available . Reason for Disposition  [1] Small swelling or lump AND [2] unexplained AND [3] present > 1 week  Answer Assessment - Initial Assessment Questions 1. APPEARANCE of SWELLING: "What does it look like?"     Small lump under right jaw under skin  2. SIZE: "How large is the swelling?" (e.g., inches, cm; or compare to size of pinhead, tip of pen, eraser, coin, pea, grape, ping pong ball)      Size of marble  3. LOCATION: "Where is the swelling located?"     Under right jaw  4. ONSET: "When did the swelling start?"     1 week 1/2  5. COLOR: "What color is it?" "Is there more than one color?"     No color  6. PAIN: "Is there any pain?" If Yes, ask: "How bad is the pain?" (e.g., scale 1-10; or mild, moderate, severe)     - NONE (0): no pain   - MILD (1-3): doesn't interfere with normal activities    - MODERATE (4-7): interferes with normal activities or awakens from sleep    - SEVERE (8-10): excruciating pain, unable to do any normal activities     No  7. ITCH: "Does it itch?" If Yes, ask: "How bad is the itch?"      No  8. CAUSE: "What do you think caused the swelling?" 9 OTHER SYMPTOMS: "Do you have any other symptoms?" (e.g., fever)     Had sinus infection 1-2 weeks ago  Protocols used: Skin Lump or Localized Swelling-A-AH

## 2022-03-05 ENCOUNTER — Other Ambulatory Visit: Payer: Self-pay

## 2022-03-11 ENCOUNTER — Ambulatory Visit (INDEPENDENT_AMBULATORY_CARE_PROVIDER_SITE_OTHER): Payer: Self-pay | Admitting: Primary Care

## 2022-05-14 ENCOUNTER — Other Ambulatory Visit: Payer: Self-pay

## 2022-07-08 ENCOUNTER — Other Ambulatory Visit: Payer: Self-pay

## 2022-07-08 ENCOUNTER — Encounter (HOSPITAL_COMMUNITY): Payer: Self-pay | Admitting: Emergency Medicine

## 2022-07-08 ENCOUNTER — Ambulatory Visit (HOSPITAL_COMMUNITY)
Admission: EM | Admit: 2022-07-08 | Discharge: 2022-07-08 | Disposition: A | Discharge status: Home / Self Care | Payer: Self-pay | Attending: Internal Medicine | Admitting: Internal Medicine

## 2022-07-08 DIAGNOSIS — J069 Acute upper respiratory infection, unspecified: Secondary | ICD-10-CM

## 2022-07-08 DIAGNOSIS — Z1152 Encounter for screening for COVID-19: Secondary | ICD-10-CM | POA: Insufficient documentation

## 2022-07-08 DIAGNOSIS — R519 Headache, unspecified: Secondary | ICD-10-CM

## 2022-07-08 DIAGNOSIS — R509 Fever, unspecified: Secondary | ICD-10-CM | POA: Insufficient documentation

## 2022-07-08 LAB — SARS CORONAVIRUS 2 (TAT 6-24 HRS): SARS Coronavirus 2: NEGATIVE

## 2022-07-08 MED ORDER — ACETAMINOPHEN 325 MG PO TABS
975.0000 mg | ORAL_TABLET | Freq: Once | ORAL | Status: AC
  Administered 2022-07-08: 975 mg via ORAL

## 2022-07-08 MED ORDER — ACETAMINOPHEN 325 MG PO TABS
ORAL_TABLET | ORAL | Status: AC
  Filled 2022-07-08: qty 3

## 2022-07-08 MED ORDER — ONDANSETRON 4 MG PO TBDP
4.0000 mg | ORAL_TABLET | Freq: Once | ORAL | Status: AC
  Administered 2022-07-08: 4 mg via ORAL

## 2022-07-08 MED ORDER — ONDANSETRON 4 MG PO TBDP
4.0000 mg | ORAL_TABLET | Freq: Three times a day (TID) | ORAL | 0 refills | Status: DC | PRN
  Filled 2022-07-08: qty 20, 7d supply, fill #0

## 2022-07-08 MED ORDER — ONDANSETRON 4 MG PO TBDP
ORAL_TABLET | ORAL | Status: AC
  Filled 2022-07-08: qty 1

## 2022-07-08 MED ORDER — PROMETHAZINE-DM 6.25-15 MG/5ML PO SYRP
5.0000 mL | ORAL_SOLUTION | Freq: Every evening | ORAL | 0 refills | Status: DC | PRN
  Filled 2022-07-08: qty 118, 23d supply, fill #0

## 2022-07-08 MED ORDER — KETOROLAC TROMETHAMINE 30 MG/ML IJ SOLN
30.0000 mg | Freq: Once | INTRAMUSCULAR | Status: AC
  Administered 2022-07-08: 30 mg via INTRAMUSCULAR

## 2022-07-08 MED ORDER — KETOROLAC TROMETHAMINE 30 MG/ML IJ SOLN
INTRAMUSCULAR | Status: AC
  Filled 2022-07-08: qty 1

## 2022-07-08 MED ORDER — BENZONATATE 100 MG PO CAPS
100.0000 mg | ORAL_CAPSULE | Freq: Three times a day (TID) | ORAL | 0 refills | Status: DC
  Filled 2022-07-08: qty 21, 7d supply, fill #0

## 2022-07-08 NOTE — Discharge Instructions (Signed)
You have a viral upper respiratory infection.  COVID-19 testing is pending. We will call you with results if positive. If your COVID test is positive, you must stay at home until day 6 of symptoms. On day 6, you may go out into public and go back to work, but you must wear a mask until day 11 of symptoms to prevent spread to others.  Purchase mucinex (guaifenesin) 1200mg  and take this every 12 hours for the next few days to thin your nasal congestion and mucous so that you are able to get out of your body easier by coughing and blowing your nose. Drink plenty of water while taking this.  Take tessalon pearles every 8 hours as needed for cough.  Take Promethazine DM cough medication to help with your cough at nighttime so that you are able to sleep. Do not drive, drink alcohol, or go to work while taking this medication since it can make you sleepy. Only take this at nighttime.   You may take tylenol 1,000mg  and ibuprofen 600mg  every 6 hours with food as needed for fever/chills, sore throat, aches/pains, and inflammation associated with viral illness. Take this with food to avoid stomach upset.   No ibuprofen until tomorrow since I gave you a shot of ketorolac in the clinic for your headache!   If you develop any new or worsening symptoms, please return.  If your symptoms are severe, please go to the emergency room.  Follow-up with your primary care provider for further evaluation and management of your symptoms as well as ongoing wellness visits.  I hope you feel better!

## 2022-07-08 NOTE — ED Triage Notes (Signed)
Had fever of 102 since Monday. Had cough as well and then started having headaches. Taken theraflu, BC powder and another medication. Reports vomited once yesterday that looked like blood.

## 2022-07-08 NOTE — ED Provider Notes (Signed)
[facility]    CSN: [id number] Arrival date & time: 07/08/22  9024      History   Chief Complaint Chief Complaint  Patient presents with   Fever   Headache    HPI Jordan Martin is a 26 y.o. female.   Patient presents urgent care for evaluation of cough, nasal congestion, fever/chills, generalized headache, and generalized fatigue that started on July 06, 2022 (2 days ago).  No known sick contacts with similar symptoms.  She is vaccinated against COVID-19 but has not received her flu vaccine this year.  Fever 102 at highest at home.  Denies sore throat, ear pain, shortness of breath, chest pain, heart palpitations, abdominal pain, body aches, and dizziness.  She had 1 episode of nausea with vomiting yesterday and currently feels slightly nauseated.  Denies recent dietary or medication changes, diarrhea, or decreased appetite.  History of migraines, does not currently take any medications for this.  Reports associated photophobia and phonophobia with generalized bilateral frontal headache.  Denies blurry vision and decreased visual acuity.  Denies history of asthma/allergies.  She intermittently smokes marijuana but denies cigarette and other drug use.  Has been taking Coricidin for symptomatic relief over-the-counter without very much relief.  Last dose of antipyretic was yesterday.   Fever Associated symptoms: headaches   Headache Associated symptoms: fever     Past Medical History:  Diagnosis Date   Allergy    Hypertension    Obesity     Patient Active Problem List   Diagnosis Date Noted   Headache, unspecified headache type 02/23/2018   Tonsillar hypertrophy 07/31/2015   Elevated BP 11/09/2014   Absolute anemia 11/09/2014   Nail abnormality 10/08/2014   BMI (body mass index), pediatric, > 99% for age 11/08/2014   Tremor 12/27/2012   Well adolescent visit 12/26/2012   Acanthosis nigricans, acquired 10/06/2010   Allergic rhinitis, seasonal 10/06/2010    OBESITY 04/01/2007    Past Surgical History:  Procedure Laterality Date   ADENOIDECTOMY     WISDOM TOOTH EXTRACTION      OB History   No obstetric history on file.      Home Medications    Prior to Admission medications   Medication Sig Start Date End Date Taking? Authorizing Provider  benzonatate (TESSALON) 100 MG capsule Take 1 capsule (100 mg total) by mouth every 8 (eight) hours. 07/08/22  Yes Carlisle Beers, FNP  promethazine-dextromethorphan (PROMETHAZINE-DM) 6.25-15 MG/5ML syrup Take 5 mLs by mouth at bedtime as needed for cough. 07/08/22  Yes Carlisle Beers, FNP  amLODipine (NORVASC) 10 MG tablet TAKE 1 TABLET (10 MG TOTAL) BY MOUTH DAILY. 07/22/21 07/22/22  [name], NP  amLODipine (NORVASC) 10 MG tablet TAKE 1 TABLET (10 MG TOTAL) BY MOUTH DAILY. (Must have office visit for refills) 12/11/21   Hoy Register, MD  fluticasone (FLONASE) 50 MCG/ACT nasal spray 2 spray each nostril qd Patient not taking: No sig reported 08/21/16   Panosh, Neta Mends, MD  hydrochlorothiazide (HYDRODIURIL) 25 MG tablet TAKE 1 TABLET (25 MG TOTAL) BY MOUTH DAILY. 07/22/21 07/22/22  [name], NP  hydrochlorothiazide (HYDRODIURIL) 25 MG tablet TAKE 1 TABLET (25 MG TOTAL) BY MOUTH DAILY. (Must have office visit and labs for refills) 12/11/21   Hoy Register, MD    Family History Family History  Problem Relation Age of Onset   Hypertension Mother    Heart attack Father    Diabetes Father    Hyperlipidemia Father  Aneurysm Father 40       brain aneurysm   Hypertension Father    Hypertension Brother     Social History Social History   Tobacco Use   Smoking status: Never   Smokeless tobacco: Never  Vaping Use   Vaping Use: Never used  Substance Use Topics   Alcohol use: No    Alcohol/week: 0.0 standard drinks of alcohol   Drug use: Yes    Types: Marijuana     Allergies   Patient has no known allergies.   Review of Systems Review of Systems   Constitutional:  Positive for fever.  Neurological:  Positive for headaches.  Per HPI   Physical Exam Triage Vital Signs ED Triage Vitals  Enc Vitals Group     BP 07/08/22 0825 129/83     Pulse Rate 07/08/22 0825 95     Resp 07/08/22 0825 18     Temp 07/08/22 0825 99.6 F (37.6 C)     Temp Source 07/08/22 0825 Oral     SpO2 07/08/22 0825 97 %     Weight --      Height --      Head Circumference --      Peak Flow --      Pain Score 07/08/22 0823 6     Pain Loc --      Pain Edu? --      Excl. in GC? --    No data found.  Updated Vital Signs BP 129/83 (BP Location: Right Arm)   Pulse 95   Temp 99.6 F (37.6 C) (Oral)   Resp 18   LMP 06/13/2022 (Approximate)   SpO2 97%   Visual Acuity Right Eye Distance:   Left Eye Distance:   Bilateral Distance:    Right Eye Near:   Left Eye Near:    Bilateral Near:     Physical Exam Vitals and nursing note reviewed.  Constitutional:      Appearance: She is ill-appearing. She is not toxic-appearing.  HENT:     Head: Normocephalic and atraumatic.     Right Ear: Hearing, tympanic membrane, ear canal and external ear normal.     Left Ear: Hearing, tympanic membrane, ear canal and external ear normal.     Nose: Nose normal.     Mouth/Throat:     Lips: Pink.     Mouth: Mucous membranes are moist.     Pharynx: Oropharynx is clear.  Eyes:     General: Lids are normal. Vision grossly intact. Gaze aligned appropriately.     Extraocular Movements: Extraocular movements intact.     Conjunctiva/sclera: Conjunctivae normal.     Right eye: Right conjunctiva is not injected.     Left eye: Left conjunctiva is not injected.     Pupils: Pupils are equal, round, and reactive to light.     Comments: EOMs intact without pain or dizziness elicited.  Cardiovascular:     Rate and Rhythm: Normal rate and regular rhythm.     Heart sounds: Normal heart sounds, S1 normal and S2 normal.  Pulmonary:     Effort: Pulmonary effort is normal. No  respiratory distress.     Breath sounds: Normal breath sounds and air entry.  Musculoskeletal:     Cervical back: Normal range of motion and neck supple.  Lymphadenopathy:     Cervical: No cervical adenopathy.  Skin:    General: Skin is warm and dry.     Capillary Refill: Capillary refill takes less  than 2 seconds.     Findings: No rash.  Neurological:     General: No focal deficit present.     Mental Status: She is alert and oriented to person, place, and time. Mental status is at baseline.     Cranial Nerves: Cranial nerves 2-12 are intact. No dysarthria or facial asymmetry.     Sensory: Sensation is intact.     Motor: Motor function is intact.     Coordination: Coordination is intact.     Comments: Moves all 4 extremities with normal coordination voluntarily.  Psychiatric:        Mood and Affect: Mood normal.        Speech: Speech normal.        Behavior: Behavior normal.        Thought Content: Thought content normal.        Judgment: Judgment normal.      UC Treatments / Results  Labs (all labs ordered are listed, but only abnormal results are displayed) Labs Reviewed  SARS CORONAVIRUS 2 (TAT 6-24 HRS)    EKG   Radiology No results found.  Procedures Procedures (including critical care time)  Medications Ordered in UC Medications  ondansetron (ZOFRAN-ODT) disintegrating tablet 4 mg (has no administration in time range)  ketorolac (TORADOL) 30 MG/ML injection 30 mg (has no administration in time range)  acetaminophen (TYLENOL) tablet 975 mg (has no administration in time range)    Initial Impression / Assessment and Plan / UC Course  I have reviewed the triage vital signs and the nursing notes.  Pertinent labs & imaging results that were available during my care of the patient were reviewed by me and considered in my medical decision making (see chart for details).   1.  Viral URI with cough, bad headache, fever Symptoms and physical exam consistent with a  viral upper respiratory tract infection that will likely resolve with rest, fluids, and prescriptions for symptomatic relief. No indication for imaging today based on stable cardiopulmonary exam and hemodynamically stable vital signs.  COVID-19 testing is pending.  We will call patient if this is positive.  Quarantine guidelines discussed. Currently on day 3 of symptoms and does qualify for antiviral therapy.   Patient given ketorolac 30 mg IM, 4 mg Zofran ODT, and Tylenol 1000 mg in clinic today for headache and nausea.  Tessalon Perles, Promethazine DM, and Zofran sent to pharmacy for symptomatic relief to be taken as prescribed. Promethazine DM cough medication may be used as needed only at bedtime due to possible drowsiness side effect (no alcohol, working, or driving while taking this advised).  May continue using Coricidin as needed for symptomatic relief.  If this is not helping, she may use guaifenesin to break up mucus and help with cough over-the-counter.  May use ibuprofen/tylenol over the counter for body aches, fever/chills, and overall discomfort associated with viral illness. Nonpharmacologic interventions for symptom relief provided and after visit summary below.   Strict ED/urgent care return precautions given.  Patient verbalizes understanding and agreement with plan.  Counseled patient regarding possible side effects and uses of all medications prescribed at today's visit.  Patient verbalizes understanding and agreement with plan.  All questions answered.  Patient discharged from urgent care in stable condition.        Final Clinical Impressions(s) / UC Diagnoses   Final diagnoses:  Viral URI with cough  Bad headache  Fever, unspecified fever cause     Discharge Instructions  You have a viral upper respiratory infection.  COVID-19 testing is pending. We will call you with results if positive. If your COVID test is positive, you must stay at home until day 6 of symptoms.  On day 6, you may go out into public and go back to work, but you must wear a mask until day 11 of symptoms to prevent spread to others.  Purchase mucinex (guaifenesin) 1200mg  and take this every 12 hours for the next few days to thin your nasal congestion and mucous so that you are able to get out of your body easier by coughing and blowing your nose. Drink plenty of water while taking this.  Take tessalon pearles every 8 hours as needed for cough.  Take Promethazine DM cough medication to help with your cough at nighttime so that you are able to sleep. Do not drive, drink alcohol, or go to work while taking this medication since it can make you sleepy. Only take this at nighttime.   You may take tylenol 1,000mg  and ibuprofen 600mg  every 6 hours with food as needed for fever/chills, sore throat, aches/pains, and inflammation associated with viral illness. Take this with food to avoid stomach upset.   No ibuprofen until tomorrow since I gave you a shot of ketorolac in the clinic for your headache!   If you develop any new or worsening symptoms, please return.  If your symptoms are severe, please go to the emergency room.  Follow-up with your primary care provider for further evaluation and management of your symptoms as well as ongoing wellness visits.  I hope you feel better!     ED Prescriptions     Medication Sig Dispense Auth. Provider   benzonatate (TESSALON) 100 MG capsule Take 1 capsule (100 mg total) by mouth every 8 (eight) hours. 21 capsule , FNP   promethazine-dextromethorphan (PROMETHAZINE-DM) 6.25-15 MG/5ML syrup Take 5 mLs by mouth at bedtime as needed for cough. 118 mL Carlisle Beers, FNP      PDMP not reviewed this encounter.   09-14-1989 [place], Reita May 07/08/22 [phone]

## 2022-07-15 ENCOUNTER — Other Ambulatory Visit: Payer: Self-pay

## 2022-08-05 ENCOUNTER — Ambulatory Visit (INDEPENDENT_AMBULATORY_CARE_PROVIDER_SITE_OTHER): Payer: Self-pay | Admitting: *Deleted

## 2022-08-05 NOTE — Telephone Encounter (Signed)
  Chief Complaint: possible insect bite  Symptoms: itching, blisters, pain Frequency: noticed Saturday Pertinent Negatives: Patient denies fever Disposition: [] ED /[x] Urgent Care (no appt availability in office) / [] Appointment(In office/virtual)/ []  [facility]/ [] Home Care/ [] Refused Recommended Disposition /[] [place] [facility]/ []  Follow-up with PCP Additional Notes: Patient is actually at [facility] now- advised her to be seen

## 2022-08-05 NOTE — Telephone Encounter (Signed)
Summary: Abscess or bite on right arm   The patient states she has an abscess or bite on her arm near her arm pit. It is on her right arm and leaking fluid. She is currently not in any pain but it does itch with a stinging sensation. Please assist patient further         Reason for Disposition  [1] SEVERE local itching (i.e., interferes with work, school, sleep) AND [2] not improved after 24 hours of hydrocortisone cream  Answer Assessment - Initial Assessment Questions 1. APPEARANCE of BOIL: "What does the boil look like?"      Blistered- small blisters, clear drainage 2. LOCATION: "Where is the boil located?"      Inside of upper arm- right 3. NUMBER: "How many boils are there?"      1 area- multiple tiny blisters 4. SIZE: "How big is the boil?" (e.g., inches, cm; compare to size of a coin or other object)     Mosquito bite size 5. ONSET: "When did the boil start?"     Saturday 6. PAIN: "Is there any pain?" If Yes, ask: "How bad is the pain?"   (Scale 1-10; or mild, moderate, severe)     Only when touched 7. FEVER: "Do you have a fever?" If Yes, ask: "What is it, how was it measured, and when did it start?"      fever 8. SOURCE: "Have you been around anyone with boils or other Staph infections?" "Have you ever had boils before?"     Possible insect bite 9. OTHER SYMPTOMS: "Do you have any other symptoms?" (e.g., shaking chills, weakness, rash elsewhere on body)     itching  Protocols used: Boil (Skin Abscess)-A-AH, Insect Bite-A-AH

## 2022-08-05 NOTE — Telephone Encounter (Signed)
Noted  

## 2022-10-01 ENCOUNTER — Other Ambulatory Visit (INDEPENDENT_AMBULATORY_CARE_PROVIDER_SITE_OTHER): Payer: Self-pay | Admitting: Primary Care

## 2022-10-01 DIAGNOSIS — Z760 Encounter for issue of repeat prescription: Secondary | ICD-10-CM

## 2022-10-01 DIAGNOSIS — I10 Essential (primary) hypertension: Secondary | ICD-10-CM

## 2022-10-01 NOTE — Telephone Encounter (Signed)
Medication Refill - Medication: amLODipine (NORVASC) 10 MG tablet [id number] hydrochlorothiazide (HYDRODIURIL) 25 MG tablet [id number]   Has the patient contacted their pharmacy? Yes.    (Agent: If yes, when and what did the pharmacy advise?) Contact PCP   Preferred Pharmacy (with phone number or street name): [facility] [address], [place], [place] [address]  Has the patient been seen for an appointment in the last year OR does the patient have an upcoming appointment? Yes.   Upcoming appointment 10/05/22  Agent: Please be advised that RX refills may take up to 3 business days. We ask that you follow-up with your pharmacy.

## 2022-10-02 NOTE — Telephone Encounter (Signed)
Requested medications are due for refill today.  yes  Requested medications are on the active medications list.  yes  Last refill. 12/11/2021 #30 0 rf - there are also Rx's for these meds that are expired.  Future visit scheduled.   yes  Notes to clinic.  Pt is more than 3 months overdue for OV.    Requested Prescriptions  Pending Prescriptions Disp Refills   amLODipine (NORVASC) 10 MG tablet 30 tablet 0    Sig: TAKE 1 TABLET (10 MG TOTAL) BY MOUTH DAILY. (Must have office visit for refills)     Cardiovascular: Calcium Channel Blockers 2 Failed - 10/01/2022  2:40 PM      Failed - Valid encounter within last 6 months    Recent Outpatient Visits           1 year ago Screening for diabetes mellitus   [place] [facility] Kerin Perna, NP   2 years ago Need for Tdap vaccination   [place], Michelle P, NP   2 years ago Need for Tdap vaccination   [place], Michelle P, NP   2 years ago Need for hepatitis C screening test   [place], Michelle P, NP   9 years ago Sports physical   [facility], Renette Butters, MD       Future Appointments             In 3 days Kerin Perna, NP [place] - Last BP in normal range    BP Readings from Last 1 Encounters:  07/08/22 129/83         Passed - Last Heart Rate in normal range    Pulse Readings from Last 1 Encounters:  07/08/22 95          hydrochlorothiazide (HYDRODIURIL) 25 MG tablet 30 tablet 0    Sig: TAKE 1 TABLET (25 MG TOTAL) BY MOUTH DAILY. (Must have office visit and labs for refills)     Cardiovascular: Diuretics - Thiazide Failed - 10/01/2022  2:40 PM      Failed - Cr in normal range and within 180 days    Creatinine, Ser  Date Value Ref Range Status  05/03/2020 0.85 0.57 - 1.00 mg/dL Final         Failed - K  in normal range and within 180 days    Potassium  Date Value Ref Range Status  05/03/2020 4.4 3.5 - 5.2 mmol/L Final         Failed - Na in normal range and within 180 days    Sodium  Date Value Ref Range Status  05/03/2020 140 134 - 144 mmol/L Final         Failed - Valid encounter within last 6 months    Recent Outpatient Visits           1 year ago Screening for diabetes mellitus   [place] [facility] Kerin Perna, NP   2 years ago Need for Tdap vaccination   [place] [facility] Kerin Perna, NP   2 years ago Need for Tdap vaccination   [place] [facility] Kerin Perna, NP   2 years ago Need for hepatitis C screening test   [place] Kerin Perna, NP  9 years ago Sports physical   [facility] at [place], Renette Butters, MD       Future Appointments             In 3 days Kerin Perna, NP [place] - Last BP in normal range    BP Readings from Last 1 Encounters:  07/08/22 129/83

## 2022-10-05 ENCOUNTER — Ambulatory Visit (INDEPENDENT_AMBULATORY_CARE_PROVIDER_SITE_OTHER): Payer: 59 | Admitting: Primary Care

## 2022-10-05 ENCOUNTER — Encounter (INDEPENDENT_AMBULATORY_CARE_PROVIDER_SITE_OTHER): Payer: Self-pay | Admitting: Primary Care

## 2022-10-05 VITALS — BP 147/89 | HR 66 | Resp 16 | Ht 64.0 in | Wt 260.0 lb

## 2022-10-05 DIAGNOSIS — I10 Essential (primary) hypertension: Secondary | ICD-10-CM

## 2022-10-05 DIAGNOSIS — R7303 Prediabetes: Secondary | ICD-10-CM

## 2022-10-05 DIAGNOSIS — Z760 Encounter for issue of repeat prescription: Secondary | ICD-10-CM | POA: Diagnosis not present

## 2022-10-05 MED ORDER — AMLODIPINE BESYLATE 10 MG PO TABS: ORAL_TABLET | Freq: Every day | ORAL | 1 refills | Status: DC

## 2022-10-05 MED ORDER — HYDROCHLOROTHIAZIDE 25 MG PO TABS: ORAL_TABLET | Freq: Every day | ORAL | 1 refills | Status: DC

## 2022-10-05 NOTE — Progress Notes (Signed)
Jordan Martin, is a 27 y.o. female  [id number]  [id number]  DOB - 15-Oct-1995  Chief Complaint  Patient presents with   Hypertension       Subjective:   Jordan Martin is a 27 y.o. female here today for a follow up visit for HTN. Patient has No headache, No chest pain, No abdominal pain - No Nausea, No new weakness tingling or numbness, No Cough - shortness of breath. She has stop taking all her meds increased risk for MI/[place] AA, morbid obesity , prediabetes and HTN. Asked who she care about myself and my mom. Actually does not appear to be by stopping all medication.  No problems updated.  No Known Allergies  Past Medical History:  Diagnosis Date   Allergy    Hypertension    Obesity     No current outpatient medications on file prior to visit.   No current facility-administered medications on file prior to visit.    Objective:   Vitals:   10/05/22 1056 10/05/22 1058  BP: (Abnormal) 152/99 (Abnormal) 147/89  Pulse: 66   Resp: 16   SpO2: 100%   Weight: 260 lb (117.9 kg)   Height: 5\' 4"  (1.626 m)     Comprehensive ROS Pertinent positive and negative noted in HPI   Exam General appearance : Awake, alert, not in any distress. Speech Clear. Not toxic looking HEENT: Atraumatic and Normocephalic, pupils equally reactive to light and accomodation Neck: Supple, no JVD. No cervical lymphadenopathy.  Chest: Good air entry bilaterally, no added sounds  CVS: S1 S2 regular, no murmurs.  Abdomen: Bowel sounds present, Non tender and not distended with no gaurding, rigidity or rebound. Extremities: B/L Lower Ext shows no edema, both legs are warm to touch Neurology: Awake alert, and oriented X 3, CN II-XII intact, Non focal Skin: No Rash  Data Review Lab Results  Component Value Date   HGBA1C 5.7 (A) 07/22/2021   HGBA1C 5.1 05/03/2020   HGBA1C 5.4 11/18/2015    Assessment & Plan  Jordan Martin was seen today for  hypertension.  Diagnoses and all orders for this visit:  Essential hypertension BP goal - < 130/80 Explained that having normal blood pressure is the goal and medications are helping to get to goal and maintain normal blood pressure. DIET: Limit salt intake, read nutrition labels to check salt content, limit fried and high fatty foods  Avoid using multisymptom OTC cold preparations that generally contain sudafed which can rise BP. Consult with pharmacist on best cold relief products to use for persons with HTN EXERCISE Discussed incorporating exercise such as walking - 30 minutes most days of the week and can do in 10 minute intervals    -     CMP14+EGFR  Prediabetes - educated on lifestyle modifications, including but not limited to diet choices and adding exercise to daily routine.   -     CBC with Differential/Platelet -     Hemoglobin A1c  Morbid obesity ([place]) Discussed diet and exercise for person with BMI >25. Instructed: You must burn more calories than you eat. Losing 5 percent of your body weight should be considered a success. In the longer term, losing more than 15 percent of your body weight and staying at this weight is an extremely good result. However, keep in mind that even losing 5 percent of your body weight leads to important health benefits, so try not to get discouraged if you're not able to lose more than this. Refer  to nutrition/wt loss -     Lipid panel     Patient have been counseled extensively about nutrition and exercise. Other issues discussed during this visit include: low cholesterol diet, weight control and daily exercise, foot care, annual eye examinations at Ophthalmology, importance of adherence with medications and regular follow-up. We also discussed long term complications of uncontrolled diabetes and hypertension.   Return in about 4 weeks (around 11/02/2022), or BP ck.  The patient was given clear instructions to go to ER or return to medical  center if symptoms don't improve, worsen or new problems develop. The patient verbalized understanding. The patient was told to call to get lab results if they haven't heard anything in the next week.   This note has been created with Surveyor, quantity. Any transcriptional errors are unintentional.   Kerin Perna, NP 10/05/2022, 11:36 AM

## 2022-10-05 NOTE — Patient Instructions (Signed)
Here are common active ingredients found in acne products used on the skin and how they work. Benzoyl peroxide. This ingredient kills bacteria that cause acne, helps remove excess oil from the skin and removes dead skin cells, which can clog pores. ... Adapalene. ... Salicylic acid. ... Azelaic acid. ... Alpha hydroxy acids.Hypertension, Adult High blood pressure (hypertension) is when the force of blood pumping through the arteries is too strong. The arteries are the blood vessels that carry blood from the heart throughout the body. Hypertension forces the heart to work harder to pump blood and may cause arteries to become narrow or stiff. Untreated or uncontrolled hypertension can lead to a heart attack, heart failure, a stroke, kidney disease, and other problems. A blood pressure reading consists of a higher number over a lower number. Ideally, your blood pressure should be below 120/80. The first ("top") number is called the systolic pressure. It is a measure of the pressure in your arteries as your heart beats. The second ("bottom") number is called the diastolic pressure. It is a measure of the pressure in your arteries as the heart relaxes. What are the causes? The exact cause of this condition is not known. There are some conditions that result in high blood pressure. What increases the risk? Certain factors may make you more likely to develop high blood pressure. Some of these risk factors are under your control, including: Smoking. Not getting enough exercise or physical activity. Being overweight. Having too much fat, sugar, calories, or salt (sodium) in your diet. Drinking too much alcohol. Other risk factors include: Having a personal history of heart disease, diabetes, high cholesterol, or kidney disease. Stress. Having a family history of high blood pressure and high cholesterol. Having obstructive sleep apnea. Age. The risk increases with age. What are the signs or  symptoms? High blood pressure may not cause symptoms. Very high blood pressure (hypertensive crisis) may cause: Headache. Fast or irregular heartbeats (palpitations). Shortness of breath. Nosebleed. Nausea and vomiting. Vision changes. Severe chest pain, dizziness, and seizures. How is this diagnosed? This condition is diagnosed by measuring your blood pressure while you are seated, with your arm resting on a flat surface, your legs uncrossed, and your feet flat on the floor. The cuff of the blood pressure monitor will be placed directly against the skin of your upper arm at the level of your heart. Blood pressure should be measured at least twice using the same arm. Certain conditions can cause a difference in blood pressure between your right and left arms. If you have a high blood pressure reading during one visit or you have normal blood pressure with other risk factors, you may be asked to: Return on a different day to have your blood pressure checked again. Monitor your blood pressure at home for 1 week or longer. If you are diagnosed with hypertension, you may have other blood or imaging tests to help your health care provider understand your overall risk for other conditions. How is this treated? This condition is treated by making healthy lifestyle changes, such as eating healthy foods, exercising more, and reducing your alcohol intake. You may be referred for counseling on a healthy diet and physical activity. Your health care provider may prescribe medicine if lifestyle changes are not enough to get your blood pressure under control and if: Your systolic blood pressure is above 130. Your diastolic blood pressure is above 80. Your personal target blood pressure may vary depending on your medical conditions, your age, and  other factors. Follow these instructions at home: Eating and drinking  Eat a diet that is high in fiber and potassium, and low in sodium, added sugar, and fat. An  example of this eating plan is called the DASH diet. DASH stands for Dietary Approaches to Stop Hypertension. To eat this way: Eat plenty of fresh fruits and vegetables. Try to fill one half of your plate at each meal with fruits and vegetables. Eat whole grains, such as whole-wheat pasta, brown rice, or whole-grain bread. Fill about one fourth of your plate with whole grains. Eat or drink low-fat dairy products, such as skim milk or low-fat yogurt. Avoid fatty cuts of meat, processed or cured meats, and poultry with skin. Fill about one fourth of your plate with lean proteins, such as fish, chicken without skin, beans, eggs, or tofu. Avoid pre-made and processed foods. These tend to be higher in sodium, added sugar, and fat. Reduce your daily sodium intake. Many people with hypertension should eat less than 1,500 mg of sodium a day. Do not drink alcohol if: Your health care provider tells you not to drink. You are pregnant, may be pregnant, or are planning to become pregnant. If you drink alcohol: Limit how much you have to: 0-1 drink a day for women. 0-2 drinks a day for men. Know how much alcohol is in your drink. In the [place] one drink equals one 12 oz bottle of beer (355 mL), one 5 oz glass of wine (148 mL), or one 1 oz glass of hard liquor (44 mL). Lifestyle  Work with your health care provider to maintain a healthy body weight or to lose weight. Ask what an ideal weight is for you. Get at least 30 minutes of exercise that causes your heart to beat faster (aerobic exercise) most days of the week. Activities may include walking, swimming, or biking. Include exercise to strengthen your muscles (resistance exercise), such as Pilates or lifting weights, as part of your weekly exercise routine. Try to do these types of exercises for 30 minutes at least 3 days a week. Do not use any products that contain nicotine or tobacco. These products include cigarettes, chewing tobacco, and vaping devices,  such as e-cigarettes. If you need help quitting, ask your health care provider. Monitor your blood pressure at home as told by your health care provider. Keep all follow-up visits. This is important. Medicines Take over-the-counter and prescription medicines only as told by your health care provider. Follow directions carefully. Blood pressure medicines must be taken as prescribed. Do not skip doses of blood pressure medicine. Doing this puts you at risk for problems and can make the medicine less effective. Ask your health care provider about side effects or reactions to medicines that you should watch for. Contact a health care provider if you: Think you are having a reaction to a medicine you are taking. Have headaches that keep coming back (recurring). Feel dizzy. Have swelling in your ankles. Have trouble with your vision. Get help right away if you: Develop a severe headache or confusion. Have unusual weakness or numbness. Feel faint. Have severe pain in your chest or abdomen. Vomit repeatedly. Have trouble breathing. These symptoms may be an emergency. Get help right away. Call 911. Do not wait to see if the symptoms will go away. Do not drive yourself to the hospital. Summary Hypertension is when the force of blood pumping through your arteries is too strong. If this condition is not controlled, it may put you  at risk for serious complications. Your personal target blood pressure may vary depending on your medical conditions, your age, and other factors. For most people, a normal blood pressure is less than 120/80. Hypertension is treated with lifestyle changes, medicines, or a combination of both. Lifestyle changes include losing weight, eating a healthy, low-sodium diet, exercising more, and limiting alcohol. This information is not intended to replace advice given to you by your health care provider. Make sure you discuss any questions you have with your health care  provider. Document Revised: 05/06/2021 Document Reviewed: 05/06/2021 Elsevier Patient Education  [employer].

## 2022-10-06 LAB — CBC WITH DIFFERENTIAL/PLATELET
Basophils Absolute: 0.1 10*3/uL (ref 0.0–0.2)
Basos: 1 %
EOS (ABSOLUTE): 0.1 10*3/uL (ref 0.0–0.4)
Eos: 2 %
Hematocrit: 35.2 % (ref 34.0–46.6)
Hemoglobin: 11 g/dL — ABNORMAL LOW (ref 11.1–15.9)
Immature Grans (Abs): 0 10*3/uL (ref 0.0–0.1)
Immature Granulocytes: 0 %
Lymphocytes Absolute: 2.1 10*3/uL (ref 0.7–3.1)
Lymphs: 29 %
MCH: 23.4 pg — ABNORMAL LOW (ref 26.6–33.0)
MCHC: 31.3 g/dL — ABNORMAL LOW (ref 31.5–35.7)
MCV: 75 fL — ABNORMAL LOW (ref 79–97)
Monocytes Absolute: 0.7 10*3/uL (ref 0.1–0.9)
Monocytes: 9 %
Neutrophils Absolute: 4.3 10*3/uL (ref 1.4–7.0)
Neutrophils: 59 %
Platelets: 394 10*3/uL (ref 150–450)
RBC: 4.7 x10E6/uL (ref 3.77–5.28)
RDW: 16 % — ABNORMAL HIGH (ref 11.7–15.4)
WBC: 7.3 10*3/uL (ref 3.4–10.8)

## 2022-10-06 LAB — LIPID PANEL
Chol/HDL Ratio: 2.4 ratio (ref 0.0–4.4)
Cholesterol, Total: 138 mg/dL (ref 100–199)
HDL: 57 mg/dL (ref >39)
LDL Chol Calc (NIH): 72 mg/dL (ref 0–99)
Triglycerides: 38 mg/dL (ref 0–149)
VLDL Cholesterol Cal: 9 mg/dL (ref 5–40)

## 2022-10-06 LAB — CMP14+EGFR
ALT: 16 IU/L (ref 0–32)
AST: 18 IU/L (ref 0–40)
Albumin/Globulin Ratio: 1.6 (ref 1.2–2.2)
Albumin: 4.3 g/dL (ref 4.0–5.0)
Alkaline Phosphatase: 53 IU/L (ref 44–121)
BUN/Creatinine Ratio: 15 (ref 9–23)
BUN: 12 mg/dL (ref 6–20)
Bilirubin Total: <0.2 mg/dL (ref 0.0–1.2)
CO2: 18 mmol/L — ABNORMAL LOW (ref 20–29)
Calcium: 9.3 mg/dL (ref 8.7–10.2)
Chloride: 105 mmol/L (ref 96–106)
Creatinine, Ser: 0.81 mg/dL (ref 0.57–1.00)
Globulin, Total: 2.7 g/dL (ref 1.5–4.5)
Glucose: 78 mg/dL (ref 70–99)
Potassium: 4.7 mmol/L (ref 3.5–5.2)
Sodium: 140 mmol/L (ref 134–144)
Total Protein: 7 g/dL (ref 6.0–8.5)
eGFR: 103 mL/min/{1.73_m2} (ref >59)

## 2022-10-06 LAB — HEMOGLOBIN A1C
Est. average glucose Bld gHb Est-mCnc: 114 mg/dL
Hgb A1c MFr Bld: 5.6 % (ref 4.8–5.6)

## 2022-11-02 ENCOUNTER — Ambulatory Visit (INDEPENDENT_AMBULATORY_CARE_PROVIDER_SITE_OTHER): Payer: 59

## 2023-02-10 ENCOUNTER — Telehealth: Payer: 59 | Admitting: Nurse Practitioner

## 2023-02-10 DIAGNOSIS — L0232 Furuncle of buttock: Secondary | ICD-10-CM

## 2023-02-10 NOTE — Progress Notes (Signed)
Jordan Martin,  This looks like it may need to be drained, I would suggest an Urgent Care appointment today for assessment and treatment.    I feel your condition warrants further evaluation and I recommend that you be seen in a face to face visit.   NOTE: There will be NO CHARGE for this eVisit   If you are having a true medical emergency please call 911.      For an urgent face to face visit, [place] has eight urgent care centers for your convenience:   NEW!! [facility] Health Urgent Care Center at [facility] Get Driving Directions [phone] [address] [place], [address]    [facility] Health Urgent Care Center at [facility] Get Driving Directions [phone] [address] [place], [place] [address]   [facility] Health Urgent Care Center [facility]) Get Driving Directions [phone] [address] [place], [place] [address]  [facility] Health Urgent Care Center [facility]) Get Driving Directions [phone] [address] [place],  [place]  [address]  [facility] Health Urgent Care Center [facility] - at [employer]  [phone] [address] [place],  [place] [address]   [facility] Health Urgent Care at [facility] Get Driving Directions [phone] [address] [place], [place] [address]   [facility] Health Urgent Care at [facility] Get Driving Directions  [phone] [address] [place], [place] [address]   [facility] Health Urgent Care at [facility] Directions [phone] [address] [place], [place] [address]  Your MyChart E-visit questionnaire answers were reviewed by a board certified advanced clinical practitioner to complete your personal care plan based on your specific symptoms.  Thank you for using e-Visits.

## 2023-02-22 ENCOUNTER — Telehealth (INDEPENDENT_AMBULATORY_CARE_PROVIDER_SITE_OTHER): Payer: Self-pay | Admitting: Primary Care

## 2023-02-22 NOTE — Telephone Encounter (Signed)
Spoke to pt. Will be at apt.  

## 2023-02-23 ENCOUNTER — Other Ambulatory Visit: Payer: Self-pay

## 2023-02-23 ENCOUNTER — Ambulatory Visit (INDEPENDENT_AMBULATORY_CARE_PROVIDER_SITE_OTHER): Payer: 59 | Admitting: Primary Care

## 2023-02-23 ENCOUNTER — Encounter (INDEPENDENT_AMBULATORY_CARE_PROVIDER_SITE_OTHER): Payer: Self-pay | Admitting: Primary Care

## 2023-02-23 VITALS — BP 156/109 | HR 63 | Resp 16 | Wt 273.8 lb

## 2023-02-23 DIAGNOSIS — I10 Essential (primary) hypertension: Secondary | ICD-10-CM

## 2023-02-23 DIAGNOSIS — Z760 Encounter for issue of repeat prescription: Secondary | ICD-10-CM | POA: Diagnosis not present

## 2023-02-23 MED ORDER — AMLODIPINE BESYLATE 10 MG PO TABS
10.0000 mg | ORAL_TABLET | Freq: Every day | ORAL | 1 refills | Status: DC
  Filled 2023-02-23: qty 30, 30d supply, fill #0

## 2023-02-23 MED ORDER — HYDROCHLOROTHIAZIDE 25 MG PO TABS
25.0000 mg | ORAL_TABLET | Freq: Every day | ORAL | 1 refills | Status: DC
  Filled 2023-02-23: qty 30, 30d supply, fill #0

## 2023-02-23 NOTE — Patient Instructions (Signed)
Hypertension, Adult Hypertension is another name for high blood pressure. High blood pressure forces your heart to work harder to pump blood. This can cause problems over time. There are two numbers in a blood pressure reading. There is a top number (systolic) over a bottom number (diastolic). It is best to have a blood pressure that is below 120/80. What are the causes? The cause of this condition is not known. Some other conditions can lead to high blood pressure. What increases the risk? Some lifestyle factors can make you more likely to develop high blood pressure: Smoking. Not getting enough exercise or physical activity. Being overweight. Having too much fat, sugar, calories, or salt (sodium) in your diet. Drinking too much alcohol. Other risk factors include: Having any of these conditions: Heart disease. Diabetes. High cholesterol. Kidney disease. Obstructive sleep apnea. Having a family history of high blood pressure and high cholesterol. Age. The risk increases with age. Stress. What are the signs or symptoms? High blood pressure may not cause symptoms. Very high blood pressure (hypertensive crisis) may cause: Headache. Fast or uneven heartbeats (palpitations). Shortness of breath. Nosebleed. Vomiting or feeling like you may vomit (nauseous). Changes in how you see. Very bad chest pain. Feeling dizzy. Seizures. How is this treated? This condition is treated by making healthy lifestyle changes, such as: Eating healthy foods. Exercising more. Drinking less alcohol. Your doctor may prescribe medicine if lifestyle changes do not help enough and if: Your top number is above 130. Your bottom number is above 80. Your personal target blood pressure may vary. Follow these instructions at home: Eating and drinking  If told, follow the DASH eating plan. To follow this plan: Fill one half of your plate at each meal with fruits and vegetables. Fill one fourth of your plate  at each meal with whole grains. Whole grains include whole-wheat pasta, brown rice, and whole-grain bread. Eat or drink low-fat dairy products, such as skim milk or low-fat yogurt. Fill one fourth of your plate at each meal with low-fat (lean) proteins. Low-fat proteins include fish, chicken without skin, eggs, beans, and tofu. Avoid fatty meat, cured and processed meat, or chicken with skin. Avoid pre-made or processed food. Limit the amount of salt in your diet to less than 1,500 mg each day. Do not drink alcohol if: Your doctor tells you not to drink. You are pregnant, may be pregnant, or are planning to become pregnant. If you drink alcohol: Limit how much you have to: 0-1 drink a day for women. 0-2 drinks a day for men. Know how much alcohol is in your drink. In the [place] one drink equals one 12 oz bottle of beer (355 mL), one 5 oz glass of wine (148 mL), or one 1 oz glass of hard liquor (44 mL). Lifestyle  Work with your doctor to stay at a healthy weight or to lose weight. Ask your doctor what the best weight is for you. Get at least 30 minutes of exercise that causes your heart to beat faster (aerobic exercise) most days of the week. This may include walking, swimming, or biking. Get at least 30 minutes of exercise that strengthens your muscles (resistance exercise) at least 3 days a week. This may include lifting weights or doing Pilates. Do not smoke or use any products that contain nicotine or tobacco. If you need help quitting, ask your doctor. Check your blood pressure at home as told by your doctor. Keep all follow-up visits. Medicines Take over-the-counter and prescription medicines   only as told by your doctor. Follow directions carefully. Do not skip doses of blood pressure medicine. The medicine does not work as well if you skip doses. Skipping doses also puts you at risk for problems. Ask your doctor about side effects or reactions to medicines that you should watch  for. Contact a doctor if: You think you are having a reaction to the medicine you are taking. You have headaches that keep coming back. You feel dizzy. You have swelling in your ankles. You have trouble with your vision. Get help right away if: You get a very bad headache. You start to feel mixed up (confused). You feel weak or numb. You feel faint. You have very bad pain in your: Chest. Belly (abdomen). You vomit more than once. You have trouble breathing. These symptoms may be an emergency. Get help right away. Call 911. Do not wait to see if the symptoms will go away. Do not drive yourself to the hospital. Summary Hypertension is another name for high blood pressure. High blood pressure forces your heart to work harder to pump blood. For most people, a normal blood pressure is less than 120/80. Making healthy choices can help lower blood pressure. If your blood pressure does not get lower with healthy choices, you may need to take medicine. This information is not intended to replace advice given to you by your health care provider. Make sure you discuss any questions you have with your health care provider. Document Revised: 04/17/2021 Document Reviewed: 04/17/2021 Elsevier Patient Education  2024 Elsevier Inc.  

## 2023-02-23 NOTE — Progress Notes (Signed)
[facility]  Jordan Martin, is a 27 y.o. female  [id number]  [id number]  DOB - 03/08/1996  Chief Complaint  Patient presents with   Hypertension    Bp medication is giving her migraines     Weight Management Screening       Subjective:   [name] is a 27 y.o. severe morbid female here today for a follow up visit for hypertension.  She stopped taking her medicine after the first refill because she started having headaches.  The shape and color change she received them from a different pharmacy. However patient has No headache, No chest pain, No abdominal pain - No Nausea, No new weakness tingling or numbness, No Cough - shortness of breath. Headaches stopped when she stopped the medication but Bp uncontrolled. She is concern to starting over taking Bp meds reviewed co morbidities and wants to loose weight.  No problems updated.  No Known Allergies  Past Medical History:  Diagnosis Date   Allergy    Hypertension    Obesity     No current outpatient medications on file prior to visit.   No current facility-administered medications on file prior to visit.    Objective:   Vitals:   02/23/23 1020 02/23/23 1022  BP: (Abnormal) 152/101 (Abnormal) 156/109  Pulse: 63   Resp: 16   SpO2: 100%   Weight: 273 lb 12.8 oz (124.2 kg)     Comprehensive ROS Pertinent positive and negative noted in HPI   Exam General appearance : Awake, alert, not in any distress. Speech Clear. Not toxic looking HEENT: Atraumatic and Normocephalic, pupils equally reactive to light and accomodation Neck: Supple, no JVD. No cervical lymphadenopathy.  Chest: Good air entry bilaterally, no added sounds  CVS: S1 S2 regular, no murmurs.  Abdomen: Bowel sounds present, Non tender and not distended with no gaurding, rigidity or rebound. Extremities: B/L Lower Ext shows no edema, both legs are warm to touch Neurology: Awake alert, and oriented X 3, Non focal Skin: No  Rash  Data Review Lab Results  Component Value Date   HGBA1C 5.6 10/05/2022   HGBA1C 5.7 (A) 07/22/2021   HGBA1C 5.1 05/03/2020    Assessment & Plan  Jordan Martin was seen today for hypertension and weight management screening.  Diagnoses and all orders for this visit:  Essential hypertension DASH DIET; No salt or low sodium diet Take all medication as prescribed. Avoid smoked meats which are high in sodium content. Avoid soda which contains sodium and are high in sugar which increases your risk for diabetes.   Morbid obesity (HCC) Discussed diet and exercise for person with BMI >25. Instructed: You must burn more calories than you eat. Losing 5 percent of your body weight should be considered a success. In the longer term, losing more than 15 percent of your body weight and staying at this weight is an extremely good result. However, keep in mind that even losing 5 percent of your body weight leads to important health benefits, so try not to get discouraged if you're not able to lose more than this. Will recheck weight in 3-6 months.  -     Amb Ref to Medical Weight Management  Medication refill -     amLODipine (NORVASC) 10 MG tablet; TAKE 1 TABLET (10 MG TOTAL) BY MOUTH DAILY. (Must have office visit for refills) -     hydrochlorothiazide (HYDRODIURIL) 25 MG tablet; TAKE 1 TABLET (25 MG TOTAL) BY MOUTH DAILY.  Patient have been counseled  extensively about nutrition and exercise. Other issues discussed during this visit include: low cholesterol diet, weight control and daily exercise, foot care, annual eye examinations at Ophthalmology, importance of adherence with medications and regular follow-up. We also discussed long term complications of uncontrolled diabetes and hypertension.   Return in about 3 weeks (around 03/16/2023) for bp ck.  The patient was given clear instructions to go to ER or return to medical center if symptoms don't improve, worsen or new problems develop. The patient  verbalized understanding. The patient was told to call to get lab results if they haven't heard anything in the next week.   This note has been created with Education officer, environmental. Any transcriptional errors are unintentional.   [name], NP 02/23/2023, 12:59 PM

## 2023-02-25 DIAGNOSIS — R7303 Prediabetes: Secondary | ICD-10-CM | POA: Diagnosis not present

## 2023-02-25 DIAGNOSIS — R112 Nausea with vomiting, unspecified: Secondary | ICD-10-CM | POA: Diagnosis not present

## 2023-02-25 DIAGNOSIS — G43009 Migraine without aura, not intractable, without status migrainosus: Secondary | ICD-10-CM | POA: Diagnosis not present

## 2023-02-25 DIAGNOSIS — D649 Anemia, unspecified: Secondary | ICD-10-CM | POA: Diagnosis not present

## 2023-02-25 DIAGNOSIS — Z6841 Body Mass Index (BMI) 40.0 and over, adult: Secondary | ICD-10-CM | POA: Diagnosis not present

## 2023-03-10 DIAGNOSIS — G43009 Migraine without aura, not intractable, without status migrainosus: Secondary | ICD-10-CM | POA: Diagnosis not present

## 2023-03-10 DIAGNOSIS — D649 Anemia, unspecified: Secondary | ICD-10-CM | POA: Diagnosis not present

## 2023-03-10 DIAGNOSIS — R7303 Prediabetes: Secondary | ICD-10-CM | POA: Diagnosis not present

## 2023-03-10 DIAGNOSIS — Z6841 Body Mass Index (BMI) 40.0 and over, adult: Secondary | ICD-10-CM | POA: Diagnosis not present

## 2023-03-18 ENCOUNTER — Encounter (INDEPENDENT_AMBULATORY_CARE_PROVIDER_SITE_OTHER): Payer: Self-pay | Admitting: Primary Care

## 2023-03-18 ENCOUNTER — Other Ambulatory Visit: Payer: Self-pay

## 2023-03-18 ENCOUNTER — Ambulatory Visit (INDEPENDENT_AMBULATORY_CARE_PROVIDER_SITE_OTHER): Payer: 59 | Admitting: Primary Care

## 2023-03-18 VITALS — BP 129/84 | HR 75 | Resp 16 | Ht 64.0 in | Wt 258.0 lb

## 2023-03-18 DIAGNOSIS — Z760 Encounter for issue of repeat prescription: Secondary | ICD-10-CM

## 2023-03-18 DIAGNOSIS — I10 Essential (primary) hypertension: Secondary | ICD-10-CM

## 2023-03-18 MED ORDER — AMLODIPINE BESYLATE 10 MG PO TABS
10.0000 mg | ORAL_TABLET | Freq: Every day | ORAL | 1 refills | Status: DC
  Filled 2023-03-18: qty 90, 90d supply, fill #0
  Filled 2023-03-19: qty 30, 30d supply, fill #0
  Filled 2023-04-26: qty 30, 30d supply, fill #1
  Filled 2023-06-02: qty 30, 30d supply, fill #2
  Filled 2023-07-02: qty 30, 30d supply, fill #3
  Filled 2023-08-11: qty 30, 30d supply, fill #4
  Filled 2023-09-13: qty 30, 30d supply, fill #5

## 2023-03-18 MED ORDER — HYDROCHLOROTHIAZIDE 25 MG PO TABS
25.0000 mg | ORAL_TABLET | Freq: Every day | ORAL | 1 refills | Status: DC
  Filled 2023-03-18: qty 90, 90d supply, fill #0
  Filled 2023-03-19: qty 30, 30d supply, fill #0
  Filled 2023-04-26: qty 30, 30d supply, fill #1
  Filled 2023-06-02: qty 30, 30d supply, fill #2
  Filled 2023-07-02: qty 30, 30d supply, fill #3
  Filled 2023-08-11: qty 30, 30d supply, fill #4
  Filled 2023-09-13: qty 30, 30d supply, fill #5

## 2023-03-18 NOTE — Progress Notes (Signed)
[facility]   [name] is a 27 y.o. female presents for hypertension evaluation,  Bp improving . Denies shortness of breath, headaches, chest pain or lower extremity edema, sudden onset, vision changes, unilateral weakness, dizziness, paresthesias  Patient reports adherence with medications. Since starting weight loss management she has lost 15 lbs in 1 month. She feels Jordan Martin and she is doing this for herself.  Dietary habits include: monitoring in sodium, carbs , making better food choices  Exercise habits include:weight lifting and aerobics  Family / Social history: father 75 died of MI paternal, grandfather 54 CVA    Past Medical History:  Diagnosis Date   Allergy    Hypertension    Obesity    Past Surgical History:  Procedure Laterality Date   ADENOIDECTOMY     WISDOM TOOTH EXTRACTION     No Known Allergies Current Outpatient Medications on File Prior to Visit  Medication Sig Dispense Refill   amLODipine (NORVASC) 10 MG tablet TAKE 1 TABLET (10 MG TOTAL) BY MOUTH DAILY. (Must have office visit for refills) 90 tablet 1   hydrochlorothiazide (HYDRODIURIL) 25 MG tablet Take 1 tablet (25 mg total) by mouth daily. 90 tablet 1   No current facility-administered medications on file prior to visit.   Social History   Socioeconomic History   Marital status: Single    Spouse name: Not on file   Number of children: Not on file   Years of education: Not on file   Highest education level: Not on file  Occupational History   Not on file  Tobacco Use   Smoking status: Never   Smokeless tobacco: Never  Vaping Use   Vaping status: Never Used  Substance and Sexual Activity   Alcohol use: No    Alcohol/week: 0.0 standard drinks of alcohol   Drug use: Yes    Types: Marijuana   Sexual activity: Never    Birth control/protection: Abstinence  Other Topics Concern   Not on file  Social History Narrative   36 to [redacted] weeks gestation 8 lbs. 3 oz. Delivered  by C-section.   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   No pets .   [employer] came   Home after first semester  Social issues  3.0 average plans to go back to school   Social Determinants of Health   Financial Resource Strain: Not on file  Food Insecurity: Not on file  Transportation Needs: Not on file  Physical Activity: Not on file  Stress: Not on file  Social Connections: Not on file  Intimate Partner Violence: Not on file   Family History  Problem Relation Age of Onset   Hypertension Mother    Heart attack Father    Diabetes Father    Hyperlipidemia Father    Aneurysm Father 40       brain aneurysm   Hypertension Father    Hypertension Brother      OBJECTIVE:  Vitals:   03/18/23 0837  BP: 129/84  Pulse: 75  Resp: 16  SpO2: 100%  Weight: 258 lb (117 kg)  Height: 5\' 4"  (1.626 m)    Physical  Exam Vitals reviewed.  Constitutional:      Appearance: She is obese.  HENT:     Head: Normocephalic.     Right Ear: External ear normal.     Left Ear: External ear normal.     Nose: Nose normal.  Eyes:     Extraocular Movements: Extraocular movements intact.  Cardiovascular:     Rate and Rhythm: Normal rate and regular rhythm.  Pulmonary:     Effort: Pulmonary effort is normal.     Breath sounds: Normal breath sounds.  Abdominal:     General: Bowel sounds are normal. There is distension.     Palpations: Abdomen is soft.  Musculoskeletal:     Cervical back: Normal range of motion and neck supple.  Neurological:     Mental Status: She is oriented to person, place, and time.  Psychiatric:        Mood and Affect: Mood normal.        Behavior: Behavior normal.    ROS Comprehensive ROS Pertinent positive and negative noted in  HPI   Last 3 Office BP readings: BP Readings from Last 3 Encounters:  03/18/23 129/84  02/23/23 (!) 156/109  10/05/22 (!) 147/89    BMET    Component Value Date/Time   NA 140 10/05/2022 1128   K 4.7 10/05/2022 1128   CL 105 10/05/2022 1128   CO2 18 (L) 10/05/2022 1128   GLUCOSE 78 10/05/2022 1128   GLUCOSE 96 08/30/2017 0822   BUN 12 10/05/2022 1128   CREATININE 0.81 10/05/2022 1128   CALCIUM 9.3 10/05/2022 1128   GFRNONAA 97 05/03/2020 1005   GFRAA 112 05/03/2020 1005    Renal function: CrCl cannot be calculated (Patient's most recent lab result is older than the maximum 21 days allowed.).  Clinical ASCVD: Yes  The ASCVD Risk score (Arnett DK, et al., 2019) failed to calculate for the following reasons:   The 2019 ASCVD risk score is only valid for ages 69 to 59  ASCVD risk factors include- Jordan Martin   ASSESSMENT & PLAN: Jordan Martin was seen today for hypertension.  Diagnoses and all orders for this visit:  Essential hypertension -Counseled on lifestyle modifications for blood pressure control including reduced dietary sodium, increased exercise, weight reduction and adequate sleep. Also, educated patient about the risk for cardiovascular events, stroke and heart attack. Also counseled patient about the importance of medication adherence. If you participate in smoking, it is important to stop using tobacco as this will increase the risks associated with uncontrolled blood pressure.   Goal BP:  For patients younger than 60: Goal BP < 130/80. For patients 60 and older: Goal BP < 140/90. For patients with diabetes: Goal BP < 130/80. Your most recent BP: 129/84  Minimize salt intake. Minimize alcohol intake  Morbid obesity (HCC) Pleased with weight loss and staying on task and Bp at that visit was 117/72  This note has been created with Education officer, environmental. Any transcriptional errors are unintentional.   [name],  NP 03/18/2023, 8:43 AM

## 2023-03-19 ENCOUNTER — Other Ambulatory Visit: Payer: Self-pay

## 2023-03-23 ENCOUNTER — Other Ambulatory Visit: Payer: Self-pay

## 2023-04-27 ENCOUNTER — Other Ambulatory Visit: Payer: Self-pay

## 2023-06-07 ENCOUNTER — Other Ambulatory Visit: Payer: Self-pay

## 2023-06-17 ENCOUNTER — Ambulatory Visit (INDEPENDENT_AMBULATORY_CARE_PROVIDER_SITE_OTHER): Payer: 59 | Admitting: Primary Care

## 2023-06-22 ENCOUNTER — Ambulatory Visit (INDEPENDENT_AMBULATORY_CARE_PROVIDER_SITE_OTHER): Payer: 59 | Admitting: Primary Care

## 2023-06-22 ENCOUNTER — Encounter (INDEPENDENT_AMBULATORY_CARE_PROVIDER_SITE_OTHER): Payer: Self-pay | Admitting: Primary Care

## 2023-06-22 VITALS — BP 124/86 | HR 81 | Resp 16 | Wt 226.6 lb

## 2023-06-22 DIAGNOSIS — R7303 Prediabetes: Secondary | ICD-10-CM | POA: Diagnosis not present

## 2023-06-22 DIAGNOSIS — I10 Essential (primary) hypertension: Secondary | ICD-10-CM | POA: Diagnosis not present

## 2023-06-22 NOTE — Progress Notes (Signed)
[facility]  Jordan Martin, is a 27 y.o. female  [id number]  [id number]  DOB - 1996/06/15  Chief Complaint  Patient presents with   Hypertension       Subjective:   Jordan Martin is a 27 y.o. female here today for a follow up visit. Patient has No headache, No chest pain, No abdominal pain - No Nausea, No new weakness tingling or numbness, No Cough - shortness of breath Hypertension This is a chronic problem. The current episode started more than 1 year ago. The problem has been waxing and waning since onset. The problem is uncontrolled. Associated symptoms include anxiety. Risk factors for coronary artery disease include obesity and family history. Past treatments include calcium channel blockers and diuretics. The current treatment provides no improvement. There are no compliance problems.     No problems updated.  Comprehensive ROS Pertinent positive and negative noted in HPI   No Known Allergies  Past Medical History:  Diagnosis Date   Allergy    Hypertension    Obesity     Current Outpatient Medications on File Prior to Visit  Medication Sig Dispense Refill   amLODipine (NORVASC) 10 MG tablet Take 1 tablet (10 mg total) by mouth daily. (Must have office visit for refills) 90 tablet 1   hydrochlorothiazide (HYDRODIURIL) 25 MG tablet Take 1 tablet (25 mg total) by mouth daily. 90 tablet 1   No current facility-administered medications on file prior to visit.   Health Maintenance  Topic Date Due   COVID-19 Vaccine (3 - 2024-25 season) 03/14/2023   Pap Smear  06/14/2023   Flu Shot  10/11/2023*   DTaP/Tdap/Td vaccine (3 - Td or Tdap) 09/25/2030   HPV Vaccine  Completed   Hepatitis C Screening  Completed   HIV Screening  Completed  *Topic was postponed. The date shown is not the original due date.    Objective:   Vitals:   06/22/23 1150 06/22/23 1207  BP: 135/86 124/86  Pulse: 81   Resp: 16   SpO2: 97%   Weight: 226 lb 9.6 oz (102.8  kg)    BP Readings from Last 3 Encounters:  06/22/23 124/86  03/18/23 129/84  02/23/23 (!) 156/109   Physical exam: General: Vital signs reviewed.  Patient is well-developed and well-nourished, obese in no acute distress and cooperative with exam. Head: Normocephalic and atraumatic. Eyes: EOMI, conjunctivae normal, no scleral icterus. Neck: Supple, trachea midline, normal ROM, no JVD, masses, thyromegaly, or carotid bruit present. Cardiovascular: RRR, S1 normal, S2 normal, no murmurs, gallops, or rubs. Pulmonary/Chest: Clear to auscultation bilaterally, no wheezes, rales, or rhonchi. Abdominal: Soft, non-tender, non-distended, BS +, no masses, organomegaly, or guarding present. Musculoskeletal: No joint deformities, erythema, or stiffness, ROM full and nontender. Extremities: No lower extremity edema bilaterally,  pulses symmetric and intact bilaterally. No cyanosis or clubbing. Neurological: A&O x3, Strength is normal Skin: Warm, dry and intact. No rashes or erythema. Psychiatric: Normal mood and affect. speech and behavior is normal. Cognition and memory are normal.     Assessment & Plan  Lamoine was seen today for hypertension.  Diagnoses and all orders for this visit:  Prediabetes -     Hemoglobin A1c  Essential hypertension Well controlled Explained that having normal blood pressure is the goal and medications are helping to get to goal and maintain normal blood pressure. DIET: Limit salt intake, read nutrition labels to check salt content, limit fried and high fatty foods  Avoid using multisymptom OTC cold preparations that  generally contain sudafed which can rise BP. Consult with pharmacist on best cold relief products to use for persons with HTN EXERCISE Discussed incorporating exercise such as walking - 30 minutes most days of the week and can do in 10 minute intervals    -     CBC with Differential/Platelet -     CMP14+EGFR      Patient have been counseled  extensively about nutrition and exercise. Other issues discussed during this visit include: low cholesterol diet, weight control and daily exercise, foot care, annual eye examinations at Ophthalmology, importance of adherence with medications and regular follow-up. We also discussed long term complications of uncontrolled diabetes and hypertension.   Return in about 3 months (around 09/20/2023).  The patient was given clear instructions to go to ER or return to medical center if symptoms don't improve, worsen or new problems develop. The patient verbalized understanding. The patient was told to call to get lab results if they haven't heard anything in the next week.   This note has been created with Education officer, environmental. Any transcriptional errors are unintentional.   Jordan Sessions, NP 06/29/2023, 9:15 PM

## 2023-06-23 LAB — CBC WITH DIFFERENTIAL/PLATELET
Basophils Absolute: 0 10*3/uL (ref 0.0–0.2)
Basos: 0 %
EOS (ABSOLUTE): 0 10*3/uL (ref 0.0–0.4)
Eos: 1 %
Hematocrit: 40 % (ref 34.0–46.6)
Hemoglobin: 12.3 g/dL (ref 11.1–15.9)
Immature Grans (Abs): 0 10*3/uL (ref 0.0–0.1)
Immature Granulocytes: 0 %
Lymphocytes Absolute: 2.1 10*3/uL (ref 0.7–3.1)
Lymphs: 33 %
MCH: 24.8 pg — ABNORMAL LOW (ref 26.6–33.0)
MCHC: 30.8 g/dL — ABNORMAL LOW (ref 31.5–35.7)
MCV: 81 fL (ref 79–97)
Monocytes Absolute: 0.6 10*3/uL (ref 0.1–0.9)
Monocytes: 10 %
Neutrophils Absolute: 3.5 10*3/uL (ref 1.4–7.0)
Neutrophils: 56 %
Platelets: 428 10*3/uL (ref 150–450)
RBC: 4.96 x10E6/uL (ref 3.77–5.28)
RDW: 14.4 % (ref 11.7–15.4)
WBC: 6.2 10*3/uL (ref 3.4–10.8)

## 2023-06-23 LAB — CMP14+EGFR
ALT: 11 [IU]/L (ref 0–32)
AST: 17 [IU]/L (ref 0–40)
Albumin: 4.6 g/dL (ref 4.0–5.0)
Alkaline Phosphatase: 45 [IU]/L (ref 44–121)
BUN/Creatinine Ratio: 11 (ref 9–23)
BUN: 11 mg/dL (ref 6–20)
Bilirubin Total: 0.4 mg/dL (ref 0.0–1.2)
CO2: 23 mmol/L (ref 20–29)
Calcium: 10 mg/dL (ref 8.7–10.2)
Chloride: 100 mmol/L (ref 96–106)
Creatinine, Ser: 0.97 mg/dL (ref 0.57–1.00)
Globulin, Total: 2.9 g/dL (ref 1.5–4.5)
Glucose: 84 mg/dL (ref 70–99)
Potassium: 3.3 mmol/L — ABNORMAL LOW (ref 3.5–5.2)
Sodium: 139 mmol/L (ref 134–144)
Total Protein: 7.5 g/dL (ref 6.0–8.5)
eGFR: 83 mL/min/{1.73_m2} (ref >59)

## 2023-06-23 LAB — HEMOGLOBIN A1C
Est. average glucose Bld gHb Est-mCnc: 111 mg/dL
Hgb A1c MFr Bld: 5.5 % (ref 4.8–5.6)

## 2023-07-02 ENCOUNTER — Other Ambulatory Visit: Payer: Self-pay

## 2023-08-04 ENCOUNTER — Ambulatory Visit (INDEPENDENT_AMBULATORY_CARE_PROVIDER_SITE_OTHER): Payer: 59 | Admitting: Primary Care

## 2023-08-11 ENCOUNTER — Other Ambulatory Visit: Payer: Self-pay

## 2023-08-17 ENCOUNTER — Telehealth (INDEPENDENT_AMBULATORY_CARE_PROVIDER_SITE_OTHER): Payer: Self-pay | Admitting: Primary Care

## 2023-08-17 NOTE — Telephone Encounter (Signed)
 Called pt to remind them about atp. Pt will be present

## 2023-08-23 ENCOUNTER — Other Ambulatory Visit: Payer: Self-pay

## 2023-08-23 MED ORDER — PHENTERMINE HCL 15 MG PO CAPS
15.0000 mg | ORAL_CAPSULE | Freq: Every morning | ORAL | 1 refills | Status: DC
  Filled 2023-08-23 – 2023-09-13 (×2): qty 30, 30d supply, fill #0
  Filled 2023-12-28: qty 30, 30d supply, fill #1

## 2023-08-24 ENCOUNTER — Ambulatory Visit (INDEPENDENT_AMBULATORY_CARE_PROVIDER_SITE_OTHER): Payer: Self-pay | Admitting: Primary Care

## 2023-08-24 ENCOUNTER — Other Ambulatory Visit (HOSPITAL_COMMUNITY)
Admission: RE | Admit: 2023-08-24 | Discharge: 2023-08-24 | Disposition: A | Discharge status: Home / Self Care | Payer: Self-pay | Source: Ambulatory Visit | Attending: Primary Care | Admitting: Primary Care

## 2023-08-24 VITALS — BP 124/79 | HR 66 | Resp 16 | Ht 64.0 in | Wt 219.0 lb

## 2023-08-24 DIAGNOSIS — Z124 Encounter for screening for malignant neoplasm of cervix: Secondary | ICD-10-CM | POA: Insufficient documentation

## 2023-08-24 DIAGNOSIS — Z113 Encounter for screening for infections with a predominantly sexual mode of transmission: Secondary | ICD-10-CM | POA: Insufficient documentation

## 2023-08-24 NOTE — Progress Notes (Signed)
  [facility]  WELL-WOMAN PHYSICAL & PAP Patient name: Jordan Martin MRN [id number]  Date of birth: 1996-03-13 Chief Complaint:   None -  History of Present Illness:   Jordan Martin is a 28 y.o. No obstetric history on file. female being seen today for a routine well-woman exam.   CC:  None The current method of family planning is none.  No LMP recorded. Last pap 2021. Results were: normal Family h/o breast cancer: Yes maternal and faternal grand mothers had breast cancer  Family h/o colorectal cancer: Yes Health Maintenance  Topic Date Due   COVID-19 Vaccine (3 - 2024-25 season) 03/14/2023   Cervical Cancer Screening (Pap smear)  06/14/2023   INFLUENZA VACCINE  10/11/2023 (Originally 02/11/2023)   DTaP/Tdap/Td (3 - Td or Tdap) 09/25/2030   HPV VACCINES  Completed   Hepatitis C Screening  Completed   HIV Screening  Completed     Review of Systems:    Denies any headaches, blurred vision, fatigue, shortness of breath, chest pain, abdominal pain, abnormal vaginal discharge/itching/odor/irritation, problems with periods, bowel movements, urination, or intercourse unless otherwise stated above.  Pertinent History Reviewed:   Reviewed past medical,surgical, social and family history.  Reviewed problem list, medications and allergies.  Physical Assessment:  There were no vitals filed for this visit.There is no height or weight on file to calculate BMI.        Physical Examination:  General appearance - well appearing, and in no distress Mental status - alert, oriented to person, place, and time Psych:  She has a normal mood and affect Skin - warm and dry, normal color, no suspicious lesions noted Chest - effort normal, all lung fields clear to auscultation bilaterally Heart - normal rate and regular rhythm Neck:  midline trachea, no thyromegaly or nodules Breasts - breasts appear normal, no suspicious masses, no skin or nipple changes or axillary  nodes Educated patient on proper self breast examination and had patient to demonstrate SBE. Abdomen - soft, nontender, nondistended, no masses or organomegaly Pelvic-VULVA: normal appearing vulva with no masses, tenderness or lesions   VAGINA: normal appearing vagina with normal color and discharge, no lesions   CERVIX: normal appearing cervix without discharge or lesions, no CMT UTERUS: uterus is felt to be normal size, shape, consistency and nontender  ADNEXA: No adnexal masses or tenderness noted. Extremities:  No swelling or varicosities noted  No results found for this or any previous visit (from the past 24 hours).   Assessment & Plan:  Diagnoses and all orders for this visit:  Cervical cancer screening -     Cytology - PAP  Screening for STD (sexually transmitted disease) -     Cervicovaginal ancillary only -     HIV antibody (with reflex)    Follow-up: Return in about 6 months (around 02/21/2024) for Bp f/u and weight.  This note has been created with Education officer, environmental. Any transcriptional errors are unintentional.   [name], NP 08/24/2023, 11:37 AM

## 2023-08-25 ENCOUNTER — Encounter (INDEPENDENT_AMBULATORY_CARE_PROVIDER_SITE_OTHER): Payer: Self-pay | Admitting: Primary Care

## 2023-08-26 LAB — CERVICOVAGINAL ANCILLARY ONLY
Bacterial Vaginitis (gardnerella): NEGATIVE
Candida Glabrata: NEGATIVE
Candida Vaginitis: NEGATIVE
Chlamydia: NEGATIVE
Comment: NEGATIVE
Comment: NEGATIVE
Comment: NEGATIVE
Comment: NEGATIVE
Comment: NEGATIVE
Comment: NORMAL
Neisseria Gonorrhea: NEGATIVE
Trichomonas: NEGATIVE

## 2023-08-27 LAB — CYTOLOGY - PAP
Comment: NEGATIVE
Diagnosis: NEGATIVE
High risk HPV: NEGATIVE

## 2023-08-31 ENCOUNTER — Encounter (INDEPENDENT_AMBULATORY_CARE_PROVIDER_SITE_OTHER): Payer: Self-pay | Admitting: Primary Care

## 2023-09-01 ENCOUNTER — Other Ambulatory Visit: Payer: Self-pay

## 2023-09-13 ENCOUNTER — Other Ambulatory Visit: Payer: Self-pay

## 2023-10-18 ENCOUNTER — Other Ambulatory Visit (INDEPENDENT_AMBULATORY_CARE_PROVIDER_SITE_OTHER): Payer: Self-pay | Admitting: Primary Care

## 2023-10-18 DIAGNOSIS — Z760 Encounter for issue of repeat prescription: Secondary | ICD-10-CM

## 2023-10-18 DIAGNOSIS — I10 Essential (primary) hypertension: Secondary | ICD-10-CM

## 2023-10-19 NOTE — Telephone Encounter (Signed)
 Requested medication (s) are due for refill today: yes  Requested medication (s) are on the active medication list: yes  Last refill:  both last refilled 03/18/23 #90 1 RF  Future visit scheduled: yes   Notes to clinic:  Notation: must have OV for refills - sending back   Requested Prescriptions  Pending Prescriptions Disp Refills   amLODipine (NORVASC) 10 MG tablet 90 tablet 1    Sig: Take 1 tablet (10 mg total) by mouth daily. (Must have office visit for refills)     Cardiovascular: Calcium Channel Blockers 2 Passed - 10/19/2023  2:15 PM      Passed - Last BP in normal range    BP Readings from Last 1 Encounters:  08/25/23 124/79         Passed - Last Heart Rate in normal range    Pulse Readings from Last 1 Encounters:  08/25/23 66         Passed - Valid encounter within last 6 months    Recent Outpatient Visits           1 month ago Cervical cancer screening   [place] [facility] [name], NP   3 months ago Prediabetes   [place] [facility] [name], NP   7 months ago Essential hypertension   [place] [facility] [name], NP   7 months ago Essential hypertension   [place] [facility] [name], NP   1 year ago Essential hypertension   [place] [facility] [name], NP       Future Appointments             In 4 months Randa Evens, Kinnie Scales, NP [place] [facility]             hydrochlorothiazide (HYDRODIURIL) 25 MG tablet 90 tablet 1    Sig: Take 1 tablet (25 mg total) by mouth daily.     Cardiovascular: Diuretics - Thiazide Failed - 10/19/2023  2:15 PM      Failed - K in normal range and within 180 days    Potassium  Date Value Ref Range Status  06/22/2023 3.3 (L) 3.5 - 5.2 mmol/L Final         Passed - Cr in normal range and within 180 days    Creatinine, Ser  Date Value Ref Range  Status  06/22/2023 0.97 0.57 - 1.00 mg/dL Final         Passed - Na in normal range and within 180 days    Sodium  Date Value Ref Range Status  06/22/2023 139 134 - 144 mmol/L Final         Passed - Last BP in normal range    BP Readings from Last 1 Encounters:  08/25/23 124/79         Passed - Valid encounter within last 6 months    Recent Outpatient Visits           1 month ago Cervical cancer screening   [place] [facility] [name], NP   3 months ago Prediabetes   [place] [facility] [name], NP   7 months ago Essential hypertension   [place] [facility] [name], NP   7 months ago Essential hypertension   [place] [facility] [name], NP   1 year ago Essential hypertension  [place] [facility] [name], NP       Future Appointments             In 4 months Randa Evens, Kinnie Scales, NP [facility]

## 2023-10-20 ENCOUNTER — Other Ambulatory Visit (INDEPENDENT_AMBULATORY_CARE_PROVIDER_SITE_OTHER): Payer: Self-pay | Admitting: Primary Care

## 2023-10-20 DIAGNOSIS — Z760 Encounter for issue of repeat prescription: Secondary | ICD-10-CM

## 2023-10-20 DIAGNOSIS — I10 Essential (primary) hypertension: Secondary | ICD-10-CM

## 2023-10-20 NOTE — Telephone Encounter (Signed)
 Copied from CRM [id number]. Topic: Clinical - Medication Refill >> Oct 20, 2023 12:09 PM Patsy Lager T wrote: Most Recent Primary Care Visit:  Provider: [name]  Department: [facility]  Visit Type: OFFICE VISIT  Date: 08/24/2023  Medication: amLODipine (NORVASC) 10 MG tablet and hydrochlorothiazide (HYDRODIURIL) 25 MG tablet  Has the patient contacted their pharmacy? Yes (Agent: If no, request that the patient contact the pharmacy for the refill. If patient does not wish to contact the pharmacy document the reason why and proceed with request.) (Agent: If yes, when and what did the pharmacy advise?)  Is this the correct pharmacy for this prescription? Yes If no, delete pharmacy and type the correct one.  This is the patient's preferred pharmacy:  [facility] [address] [place] [address] Phone: [phone] Fax: [phone]   Has the prescription been filled recently? Yes  Is the patient out of the medication? Yes  Has the patient been seen for an appointment in the last year OR does the patient have an upcoming appointment? Yes  Can we respond through MyChart? No  Agent: Please be advised that Rx refills may take up to 3 business days. We ask that you follow-up with your pharmacy.

## 2023-10-21 ENCOUNTER — Other Ambulatory Visit: Payer: Self-pay

## 2023-10-21 MED ORDER — AMLODIPINE BESYLATE 10 MG PO TABS
10.0000 mg | ORAL_TABLET | Freq: Every day | ORAL | 0 refills | Status: DC
  Filled 2023-10-21: qty 90, 90d supply, fill #0

## 2023-10-21 MED ORDER — HYDROCHLOROTHIAZIDE 25 MG PO TABS
25.0000 mg | ORAL_TABLET | Freq: Every day | ORAL | 0 refills | Status: DC
  Filled 2023-10-21 (×2): qty 90, 90d supply, fill #0

## 2023-10-21 NOTE — Telephone Encounter (Signed)
 Requested Prescriptions  Pending Prescriptions Disp Refills   amLODipine (NORVASC) 10 MG tablet 90 tablet 0    Sig: Take 1 tablet (10 mg total) by mouth daily. (Must have office visit for refills)     Cardiovascular: Calcium Channel Blockers 2 Passed - 10/21/2023  9:44 AM      Passed - Last BP in normal range    BP Readings from Last 1 Encounters:  08/25/23 124/79         Passed - Last Heart Rate in normal range    Pulse Readings from Last 1 Encounters:  08/25/23 66         Passed - Valid encounter within last 6 months    Recent Outpatient Visits           1 month ago Cervical cancer screening   [place] [facility] [name], NP   4 months ago Prediabetes   [place] [facility] [name], NP   7 months ago Essential hypertension   [place] [facility] [name], NP   8 months ago Essential hypertension   [place] [facility] [name], NP   1 year ago Essential hypertension   [place] [facility] [name], NP       Future Appointments             In 4 months Randa Evens, Kinnie Scales, NP [place] [facility]             hydrochlorothiazide (HYDRODIURIL) 25 MG tablet 90 tablet 0    Sig: Take 1 tablet (25 mg total) by mouth daily.     Cardiovascular: Diuretics - Thiazide Failed - 10/21/2023  9:44 AM      Failed - K in normal range and within 180 days    Potassium  Date Value Ref Range Status  06/22/2023 3.3 (L) 3.5 - 5.2 mmol/L Final         Passed - Cr in normal range and within 180 days    Creatinine, Ser  Date Value Ref Range Status  06/22/2023 0.97 0.57 - 1.00 mg/dL Final         Passed - Na in normal range and within 180 days    Sodium  Date Value Ref Range Status  06/22/2023 139 134 - 144 mmol/L Final         Passed - Last BP in normal range    BP Readings from Last 1 Encounters:  08/25/23  124/79         Passed - Valid encounter within last 6 months    Recent Outpatient Visits           1 month ago Cervical cancer screening   [place] [facility] [name], NP   4 months ago Prediabetes   [place] [facility] [name], NP   7 months ago Essential hypertension   [place] [facility] [name], NP   8 months ago Essential hypertension   [place] [facility] [name], NP   1 year ago Essential hypertension   [place] [facility] [name], NP       Future Appointments             In 4 months Randa Evens, Kinnie Scales, NP [place] [facility]

## 2023-10-21 NOTE — Telephone Encounter (Signed)
 Duplicate request, called pharmacy to verify request was received.  Requested Prescriptions  Pending Prescriptions Disp Refills   amLODipine (NORVASC) 10 MG tablet 90 tablet 1    Sig: Take 1 tablet (10 mg total) by mouth daily. (Must have office visit for refills)     Cardiovascular: Calcium Channel Blockers 2 Passed - 10/21/2023 12:52 PM      Passed - Last BP in normal range    BP Readings from Last 1 Encounters:  08/25/23 124/79         Passed - Last Heart Rate in normal range    Pulse Readings from Last 1 Encounters:  08/25/23 66         Passed - Valid encounter within last 6 months    Recent Outpatient Visits           1 month ago Cervical cancer screening   [place] [facility] [name], NP   4 months ago Prediabetes   [place] [facility] [name], NP   7 months ago Essential hypertension   [place] [facility] [name], NP   8 months ago Essential hypertension   [place] [facility] [name], NP   1 year ago Essential hypertension   [place] [facility] [name], NP       Future Appointments             In 4 months Randa Evens, Kinnie Scales, NP [place] [facility]             hydrochlorothiazide (HYDRODIURIL) 25 MG tablet 90 tablet 1    Sig: Take 1 tablet (25 mg total) by mouth daily.     Cardiovascular: Diuretics - Thiazide Failed - 10/21/2023 12:52 PM      Failed - K in normal range and within 180 days    Potassium  Date Value Ref Range Status  06/22/2023 3.3 (L) 3.5 - 5.2 mmol/L Final         Passed - Cr in normal range and within 180 days    Creatinine, Ser  Date Value Ref Range Status  06/22/2023 0.97 0.57 - 1.00 mg/dL Final         Passed - Na in normal range and within 180 days    Sodium  Date Value Ref Range Status  06/22/2023 139 134 - 144 mmol/L Final         Passed - Last  BP in normal range    BP Readings from Last 1 Encounters:  08/25/23 124/79         Passed - Valid encounter within last 6 months    Recent Outpatient Visits           1 month ago Cervical cancer screening   [place] [facility] [name], NP   4 months ago Prediabetes   [place] [facility] [name], NP   7 months ago Essential hypertension   [place] [facility] [name], NP   8 months ago Essential hypertension   [place] [facility] [name], NP   1 year ago Essential hypertension    [facility] [name], NP       Future Appointments             In 4 months Randa Evens, Kinnie Scales, NP  [facility]

## 2023-10-26 ENCOUNTER — Other Ambulatory Visit: Payer: Self-pay

## 2023-11-03 ENCOUNTER — Ambulatory Visit (INDEPENDENT_AMBULATORY_CARE_PROVIDER_SITE_OTHER): Payer: Self-pay | Admitting: Primary Care

## 2023-11-17 ENCOUNTER — Ambulatory Visit (INDEPENDENT_AMBULATORY_CARE_PROVIDER_SITE_OTHER): Payer: Self-pay | Admitting: Primary Care

## 2023-12-27 ENCOUNTER — Ambulatory Visit (INDEPENDENT_AMBULATORY_CARE_PROVIDER_SITE_OTHER): Payer: Self-pay | Admitting: Primary Care

## 2023-12-28 ENCOUNTER — Other Ambulatory Visit: Payer: Self-pay

## 2023-12-30 ENCOUNTER — Other Ambulatory Visit: Payer: Self-pay

## 2024-02-07 ENCOUNTER — Ambulatory Visit (INDEPENDENT_AMBULATORY_CARE_PROVIDER_SITE_OTHER): Payer: Self-pay | Admitting: Primary Care

## 2024-02-07 ENCOUNTER — Other Ambulatory Visit: Payer: Self-pay

## 2024-02-07 VITALS — BP 111/75

## 2024-02-07 DIAGNOSIS — Z760 Encounter for issue of repeat prescription: Secondary | ICD-10-CM

## 2024-02-07 DIAGNOSIS — Z0130 Encounter for examination of blood pressure without abnormal findings: Secondary | ICD-10-CM | POA: Diagnosis not present

## 2024-02-07 DIAGNOSIS — I10 Essential (primary) hypertension: Secondary | ICD-10-CM | POA: Diagnosis not present

## 2024-02-07 MED ORDER — HYDROCHLOROTHIAZIDE 25 MG PO TABS
25.0000 mg | ORAL_TABLET | Freq: Every day | ORAL | 1 refills | Status: DC
  Filled 2024-02-07: qty 30, 30d supply, fill #0
  Filled 2024-03-09: qty 30, 30d supply, fill #1

## 2024-02-07 MED ORDER — AMLODIPINE BESYLATE 10 MG PO TABS
10.0000 mg | ORAL_TABLET | Freq: Every day | ORAL | 1 refills | Status: DC
  Filled 2024-02-07 (×2): qty 30, 30d supply, fill #0
  Filled 2024-03-09: qty 30, 30d supply, fill #1

## 2024-02-08 ENCOUNTER — Other Ambulatory Visit: Payer: Self-pay

## 2024-02-08 NOTE — Progress Notes (Signed)
 LATE ENTRY:  Pt came into the office today for bp medication refill. Pt was last seen 08/2023 for pap and bp no refills were sent to pharmacy and pt has her 6 months f/u on 8/11.  Went and spoke with provider and per provider, Refill bp medication cancel appt and we will see her 8/11. Made pt aware   Pt wanted her bp checked before she left

## 2024-02-21 ENCOUNTER — Ambulatory Visit (INDEPENDENT_AMBULATORY_CARE_PROVIDER_SITE_OTHER): Payer: Self-pay | Admitting: Primary Care

## 2024-03-08 ENCOUNTER — Encounter (INDEPENDENT_AMBULATORY_CARE_PROVIDER_SITE_OTHER): Payer: Self-pay | Admitting: Primary Care

## 2024-03-08 ENCOUNTER — Ambulatory Visit (INDEPENDENT_AMBULATORY_CARE_PROVIDER_SITE_OTHER): Admitting: Primary Care

## 2024-03-08 VITALS — BP 125/78 | HR 72 | Temp 98.0°F | Resp 14 | Ht 64.0 in | Wt 226.8 lb

## 2024-03-08 DIAGNOSIS — F4321 Adjustment disorder with depressed mood: Secondary | ICD-10-CM | POA: Diagnosis not present

## 2024-03-08 DIAGNOSIS — I10 Essential (primary) hypertension: Secondary | ICD-10-CM | POA: Diagnosis not present

## 2024-03-08 NOTE — Patient Instructions (Signed)
 Prolonged Grief Grief is a normal response to the death of someone close to you. Feelings of fear, anger, and guilt can affect almost everyone who loses a loved one. It is also common to have symptoms of depression while you are grieving. These include problems with sleep, loss of appetite, and lack of energy. They may last for weeks or months after a loss. Prolonged grief is different from normal grief or depression. Normal grieving involves sadness and feelings of loss, but those feelings get better and heal over time. Prolonged grief is a severe type of grief that lasts for a long time, usually for several months to a year or longer. It interferes with your ability to function normally. Prolonged grief may require treatment from a mental health care provider. What are the causes? The cause of this condition is not known. It is not clear why some people continue to struggle with grief and others do not. What increases the risk? You are more likely to develop this condition if: The death of your loved one was sudden or unexpected. The death of your loved one was due to a violent event. Your loved one died from suicide. Your loved one was a child or a young person. You were very close to your loved one, or you were dependent on him or her. You have a history of depression or anxiety. You have very little to no support from others. What are the signs or symptoms? Symptoms of this condition include: Feeling disbelief or having a lack of emotion (numbness). Being unable to enjoy good memories of your loved one. Needing to avoid anything or anyone that reminds you of your loved one. Being unable to stop thinking about the death. Feeling intense anger, loneliness, helplessness, or guilt. Feeling that your life is meaningless and empty, and having trouble moving on with your life. How is this diagnosed? This condition may be diagnosed based on: Your symptoms. Prolonged grief will be diagnosed if  you have ongoing symptoms of grief for 6 months for children and 12 months or longer for adults. The effect of symptoms on your life. You may be diagnosed with this condition if your symptoms are interfering with your ability to live your life. Your health care provider may recommend that you see a mental health care provider. Many symptoms of depression are similar to the symptoms of prolonged grief. It is important to be evaluated for prolonged grief along with other mental health conditions. How is this treated? This condition is most commonly treated with talk therapy. This therapy is offered by a mental health specialist (psychiatrist). During therapy: You will learn healthy ways to cope with the loss of your loved one. Your mental health care provider may recommend antidepressant medicines. Follow these instructions at home: Lifestyle  Take care of yourself. Eat on a regular basis, and maintain a healthy diet. Eat plenty of fruits, vegetables, lean protein, and whole grains. Try to get some exercise each day. Aim for 30 minutes of exercise on most days of the week. Keep a consistent sleep schedule. Try to get 8 or more hours of sleep each night. Start doing the things that you used to enjoy. Do not use drugs or alcohol to ease your symptoms. Spend time with friends and loved ones. General instructions Take over-the-counter and prescription medicines only as told by your health care provider. Consider joining a grief (bereavement) support group to help you deal with your loss. Keep all follow-up visits. This is important.  Contact a health care provider if: Your symptoms prevent you from functioning normally. Your symptoms do not get better with treatment. Get help right away if: You have serious thoughts about hurting yourself or someone else. You have suicidal feelings. Get help right away if you feel like you may hurt yourself or others, or have thoughts about taking your own life.  Go to your nearest emergency room or: Call 911. Call the National Suicide Prevention Lifeline at 970-736-9736 or 988. This is open 24 hours a day. Text the Crisis Text Line at (617) 314-2861. Summary Prolonged grief is a severe type of grief that lasts for a long time. This grief is not likely to go away on its own. Get the help you need. Some griefs are more difficult than others and can cause this condition. You may need a certain type of treatment to help you recover if the loss of your loved one was sudden, violent, or due to suicide. You may feel guilty about moving on with your life. Getting help does not mean that you are forgetting your loved one. It means that you are taking care of yourself. Prolonged grief is best treated with talk therapy. Medicines may also be prescribed. Seek the help you need, and find support that will help you recover. This information is not intended to replace advice given to you by your health care provider. Make sure you discuss any questions you have with your health care provider. Document Revised: 02/17/2021 Document Reviewed: 02/17/2021 Elsevier Patient Education  2024 [employer].

## 2024-03-08 NOTE — Progress Notes (Signed)
 [facility]   Jordan Martin is a 29 y.o. female presents for hypertension evaluation, Denies shortness of breath, headaches, chest pain or lower extremity edema, sudden onset, vision changes, unilateral weakness, dizziness, paresthesias. Patient asked for counseling she has been mourning for 20 years.  Unaware until today.  Grief father pass at the age of 8 continues to cry and miss him Requesting counseling.  She is doing very well with weight loss management outside of practice will speak with Dr. Medford any therapist that she would recommend.  Past Medical History:  Diagnosis Date   Allergy    Hypertension    Obesity    Past Surgical History:  Procedure Laterality Date   ADENOIDECTOMY     WISDOM TOOTH EXTRACTION     No Known Allergies Current Outpatient Medications on File Prior to Visit  Medication Sig Dispense Refill   amLODipine  (NORVASC ) 10 MG tablet Take 1 tablet (10 mg total) by mouth daily. (Must have office visit for refills) 90 tablet 1   hydrochlorothiazide  (HYDRODIURIL ) 25 MG tablet Take 1 tablet (25 mg total) by mouth daily. 90 tablet 1   phentermine  15 MG capsule Take 1 capsule (15 mg total) by mouth every morning. 30 capsule 1   No current facility-administered medications on file prior to visit.   Social History   Socioeconomic History   Marital status: Single    Spouse name: Not on file   Number of children: Not on file   Years of education: Not on file   Highest education level: Not on file  Occupational History   Not on file  Tobacco Use   Smoking status: Never   Smokeless tobacco: Never  Vaping Use   Vaping status: Never Used  Substance and Sexual Activity   Alcohol use: No    Alcohol/week: 0.0 standard drinks of alcohol   Drug use: Yes    Types: Marijuana   Sexual activity: Never    Birth control/protection: Abstinence  Other Topics Concern   Not on file  Social History Narrative   36 to [redacted] weeks gestation 8 lbs. 3 oz.  Delivered by C-section.   No neonatal problems       [place]   Household of 5  with mom no DTS pet   Father died of sudden heart attack when he was in his 26s when [name] was younger  Age 52    Mom   [place] working full-time now Arts administrator   Two older siblings in good health one in the [employer].   No pets .   [employer] came   Home after first semester  Social issues  3.0 average plans to go back to school   Social Drivers of Health   Financial Resource Strain: Not on file  Food Insecurity: No Food Insecurity (08/25/2023)   Hunger Vital Sign    Worried About Running Out of Food in the Last Year: Never true    Ran Out of Food in the Last Year: Never true  Transportation Needs: No Transportation Needs (08/25/2023)   PRAPARE - Administrator, Civil Service (Medical): No    Lack of Transportation (Non-Medical): No  Physical Activity: Not on file  Stress: Not on file  Social Connections: Not on file  Intimate Partner Violence: Not At Risk (08/25/2023)   Humiliation, Afraid, Rape, and Kick questionnaire    Fear of Current or Ex-Partner: No    Emotionally Abused: No    Physically Abused: No  Sexually Abused: No   Family History  Problem Relation Age of Onset   Hypertension Mother    Heart attack Father    Diabetes Father    Hyperlipidemia Father    Aneurysm Father 45       brain aneurysm   Hypertension Father    Hypertension Brother    Health Maintenance  Topic Date Due   Hepatitis B Vaccines 19-59 Average Risk (1 of 3 - 19+ 3-dose series) Never done   COVID-19 Vaccine (3 - 2024-25 season) 03/14/2023   INFLUENZA VACCINE  02/11/2024   Cervical Cancer Screening (Pap smear)  08/23/2026   DTaP/Tdap/Td (3 - Td or Tdap) 09/25/2030   HPV VACCINES  Completed   Hepatitis C Screening  Completed   HIV Screening  Completed   Pneumococcal Vaccine  Aged Out   Meningococcal B Vaccine  Aged Out     OBJECTIVE:  Vitals:   03/08/24 1531  BP: 125/78  Pulse: 72  Resp:  14  Temp: 98 F (36.7 C)  TempSrc: Oral  SpO2: 100%  Weight: 226 lb 12.8 oz (102.9 kg)  Height: 5' 4 (1.626 m)    Physical Exam Vitals reviewed.  Constitutional:      Appearance: Normal appearance. She is obese.  HENT:     Head: Normocephalic.     Right Ear: Tympanic membrane, ear canal and external ear normal.     Left Ear: Tympanic membrane, ear canal and external ear normal.     Nose: Nose normal.     Mouth/Throat:     Mouth: Mucous membranes are moist.  Eyes:     Extraocular Movements: Extraocular movements intact.     Pupils: Pupils are equal, round, and reactive to light.  Cardiovascular:     Rate and Rhythm: Normal rate.  Pulmonary:     Effort: Pulmonary effort is normal.     Breath sounds: Normal breath sounds.  Abdominal:     General: Bowel sounds are normal.     Palpations: Abdomen is soft.  Musculoskeletal:        General: Normal range of motion.     Cervical back: Normal range of motion.  Skin:    General: Skin is warm and dry.  Neurological:     Mental Status: She is alert and oriented to person, place, and time.  Psychiatric:        Mood and Affect: Mood normal.        Behavior: Behavior normal.        Thought Content: Thought content normal.      ROS  Last 3 Office BP readings: BP Readings from Last 3 Encounters:  03/08/24 125/78  02/07/24 111/75  08/25/23 124/79    BMET    Component Value Date/Time   NA 139 06/22/2023 1150   K 3.3 (L) 06/22/2023 1150   CL 100 06/22/2023 1150   CO2 23 06/22/2023 1150   GLUCOSE 84 06/22/2023 1150   GLUCOSE 96 08/30/2017 0822   BUN 11 06/22/2023 1150   CREATININE 0.97 06/22/2023 1150   CALCIUM 10.0 06/22/2023 1150   GFRNONAA 97 05/03/2020 1005   GFRAA 112 05/03/2020 1005    Renal function: CrCl cannot be calculated (Patient's most recent lab result is older than the maximum 21 days allowed.).  Clinical ASCVD: No  The ASCVD Risk score (Arnett DK, et al., 2019) failed to calculate for the  following reasons:   The 2019 ASCVD risk score is only valid for ages 10 to 64  ASCVD risk factors include- [place]   ASSESSMENT & PLAN: Jordan Martin was seen today for hypertension.  Diagnoses and all orders for this visit:  Grieving -     AMB Referral VBCI Care Management  Essential hypertension -Counseled on lifestyle modifications for blood pressure control including reduced dietary sodium, increased exercise, weight reduction and adequate sleep. Also, educated patient about the risk for cardiovascular events, stroke and heart attack. Also counseled patient about the importance of medication adherence. If you participate in smoking, it is important to stop using tobacco as this will increase the risks associated with uncontrolled blood pressure.  Goal BP:  For patients younger than 60: Goal BP < 130/80. For patients 60 and older: Goal BP < 140/90. For patients with diabetes: Goal BP < 130/80. Your most recent BP: 125/78  Minimize salt intake. Minimize alcohol intake    This note has been created with Education officer, environmental. Any transcriptional errors are unintentional.   Rosaline SHAUNNA Bohr, NP 03/08/2024, 4:12 PM

## 2024-03-10 ENCOUNTER — Other Ambulatory Visit: Payer: Self-pay

## 2024-03-22 ENCOUNTER — Telehealth: Payer: Self-pay

## 2024-03-22 ENCOUNTER — Other Ambulatory Visit: Payer: Self-pay

## 2024-03-22 DIAGNOSIS — I10 Essential (primary) hypertension: Secondary | ICD-10-CM

## 2024-03-22 DIAGNOSIS — Z760 Encounter for issue of repeat prescription: Secondary | ICD-10-CM

## 2024-03-22 MED ORDER — WEGOVY 0.25 MG/0.5ML ~~LOC~~ SOAJ
0.2500 mg | SUBCUTANEOUS | 0 refills | Status: DC
  Filled 2024-03-22 – 2024-04-13 (×2): qty 2, 28d supply, fill #0

## 2024-03-22 MED ORDER — HYDROCHLOROTHIAZIDE 25 MG PO TABS: 25.0000 mg | ORAL_TABLET | Freq: Every day | ORAL | 1 refills | Status: DC

## 2024-03-22 MED ORDER — TOPIRAMATE 25 MG PO TABS
25.0000 mg | ORAL_TABLET | Freq: Every evening | ORAL | 3 refills | Status: DC
  Filled 2024-03-22 – 2024-04-20 (×2): qty 30, 30d supply, fill #0

## 2024-03-22 MED ORDER — AMLODIPINE BESYLATE 10 MG PO TABS: 10.0000 mg | ORAL_TABLET | Freq: Every day | ORAL | 1 refills | Status: DC

## 2024-03-22 NOTE — Telephone Encounter (Signed)
 Copied from CRM [id number]. Topic: Clinical - Medication Question >> Mar 21, 2024  4:56 PM Taleah C wrote: Reason for CRM: [email or web address] from [facility] called and stated that the patient wants to start using their pharmacy for future refills. They tried faxing a request for hydrochlorothiazide  25 mg and amlodipine  10 mg. She needs 90 day with 3 refills. Please call and advise at [phone]. Fax # [phone]  [address] [place], [place] [address] is there.

## 2024-03-23 ENCOUNTER — Telehealth: Payer: Self-pay | Admitting: *Deleted

## 2024-03-23 ENCOUNTER — Other Ambulatory Visit: Payer: Self-pay

## 2024-03-23 NOTE — Progress Notes (Signed)
 Complex Care Management Note  Care Guide Note 03/23/2024 Name: TEARIA GIBBS MRN: [id number] DOB: Jan 06, 1996  Ileana GORMAN Mau is a 28 y.o. year old female who sees Celestia Rosaline SQUIBB, NP for primary care. I reached out to Ileana GORMAN Mau by phone today to offer complex care management services.  Ms. Pillars was given information about Complex Care Management services today including:   The Complex Care Management services include support from the care team which includes your Nurse Care Manager, Clinical Social Worker, or Pharmacist.  The Complex Care Management team is here to help remove barriers to the health concerns and goals most important to you. Complex Care Management services are voluntary, and the patient may decline or stop services at any time by request to their care team member.   Complex Care Management Consent Status: Patient agreed to services and verbal consent obtained.   Follow up plan:  Telephone appointment with complex care management team member scheduled for:  04/10/24  Encounter Outcome:  Patient Scheduled  Harlene Satterfield  [facility]  [facility] Guide  Direct Dial: [phone]  Fax [phone]\

## 2024-03-30 ENCOUNTER — Other Ambulatory Visit: Payer: Self-pay

## 2024-04-03 ENCOUNTER — Other Ambulatory Visit: Payer: Self-pay

## 2024-04-10 ENCOUNTER — Other Ambulatory Visit: Payer: Self-pay | Admitting: Licensed Clinical Social Worker

## 2024-04-10 DIAGNOSIS — F4321 Adjustment disorder with depressed mood: Secondary | ICD-10-CM

## 2024-04-10 NOTE — Patient Outreach (Signed)
 Complex Care Management   Visit Note  04/10/2024  Name:  JAKELYN SQUYRES MRN: [id number] DOB: 02/24/1996  Situation: Referral received for Complex Care Management related to Grief. I obtained verbal consent from Patient.  Visit completed with Patient  on the phone  Background:   Past Medical History:  Diagnosis Date   Allergy    Hypertension    Obesity     Assessment: Patient prefers in person talk therapy but is open to virtual as well. She does not have a preference on gender for behavioral health therapy provider.  Patient Reported Symptoms:  Cognitive Cognitive Status: Able to follow simple commands, Alert and oriented to person, place, and time Cognitive/Intellectual Conditions Management [RPT]: None reported or documented in medical history or problem list   Health Maintenance Behaviors: Annual physical exam, Stress management Healing Pattern: Average Health Facilitated by: Stress management, Rest  Neurological Neurological Review of Symptoms: Headaches Neurological Management Strategies: Coping strategies, Medication therapy Neurological Self-Management Outcome: 3 (uncertain) Neurological Comment: Topamax  rx confirmed for migranes  HEENT    Ongoing headaches    Psychosocial Psychosocial Symptoms Reported: Sadness - if selected complete PHQ 2-9 Additional Psychological Details: Pt reports sadness over the passing of her father 20 years ago. She reports that she is ready to start talk therapy and is agreeable to [facility] to [facility]. Behavioral Management Strategies: Adequate rest, Support system, Coping strategies Behavioral Health Self-Management Outcome: 3 (uncertain) Major Change/Loss/Stressor/Fears (CP): Death of a loved one Techniques to Cope with Loss/Stress/Change: Diversional activities Quality of Family Relationships: involved Do you feel physically threatened by others?: No    04/10/2024    PHQ2-9 Depression Screening   Little interest or pleasure in doing  things Several days  Feeling down, depressed, or hopeless Several days (Grief continues to be a trigger esp with holidays coming up. Therapy referral placed on 04/10/24)  PHQ-2 - Total Score 2  Trouble falling or staying asleep, or sleeping too much Not at all  Feeling tired or having little energy Several days  Poor appetite or overeating  Not at all  Feeling bad about yourself - or that you are a failure or have let yourself or your family down Not at all  Trouble concentrating on things, such as reading the newspaper or watching television Not at all  Moving or speaking so slowly that other people could have noticed.  Or the opposite - being so fidgety or restless that you have been moving around a lot more than usual Not at all  Thoughts that you would be better off dead, or hurting yourself in some way Not at all  PHQ2-9 Total Score 3  If you checked off any problems, how difficult have these problems made it for you to do your work, take care of things at home, or get along with other people Somewhat difficult  Depression Interventions/Treatment Community Resources Provided [facility] therapy referral placed. Email sent to patent with information as well)    There were no vitals filed for this visit.  Medications Reviewed Today     Reviewed by Merlynn Lyle CROME, LCSW (Social Worker) on 04/10/24 at 1505  Med List Status: <None>   Medication Order Taking? Sig Documenting Provider Last Dose Status Informant  amLODipine  (NORVASC ) 10 MG tablet [id number]  Take 1 tablet (10 mg total) by mouth daily. (Must have office visit for refills) Celestia Rosaline SQUIBB, NP  Active   hydrochlorothiazide  (HYDRODIURIL ) 25 MG tablet [id number]  Take 1 tablet (25 mg total) by mouth  daily. Celestia Rosaline SQUIBB, NP  Active   phentermine  15 MG capsule [id number]  Take 1 capsule (15 mg total) by mouth every morning.   Active   semaglutide -weight management (WEGOVY ) 0.25 MG/0.5ML SOAJ SQ injection [id number]  Inject 0.25 mg into  the skin once a week.   Active   topiramate  (TOPAMAX ) 25 MG tablet [id number]  Take 1 tablet (25 mg total) by mouth at bedtime to manage migranes   Active             Recommendation:   PCP Follow-up Referral to: Go to [facility] walk in clinic if needed. Referral placed for outpatient therapy and you can also contact them to go ahead and get scheduled.  Continue Current Plan of Care  Follow Up Plan:   Referral to Licensed Clinical Social Worker  Lyle Rung, BSW, MSW, LCSW Licensed Clinical Social Worker [employer]   [facility] [email or web address] Direct Dial: [phone]

## 2024-04-10 NOTE — Patient Instructions (Signed)
 Visit Information  Thank you for taking time to visit with me today. Please don't hesitate to contact me if I can be of assistance to you before our next scheduled appointment.  Our next appointment is by telephone on 04/17/24 at 3 pm with your primary care physician's VBCI LCSW- Glendia Pear.  Please call the care guide team at [phone] if you need to cancel or reschedule your appointment.   Check your email with [facility] walk in information and scheduling tips!  Managing Loss, Adult People experience loss in many different ways throughout their lives. Events such as moving, changing jobs, and losing friends can create a sense of loss. The loss may be as serious as a major health change, divorce, death of a pet, or death of a loved one. All of these types of loss are likely to create a physical and emotional reaction known as grief. Grief is the result of a major change or an absence of something or someone that you count on. Grief is a normal reaction to loss. A variety of factors can affect your grieving experience, including: The nature of your loss. Your relationship to what or whom you lost. Your understanding of grief and how to manage it. Your support system. How to manage lifestyle changes Keep to your normal routine as much as possible. If you have trouble focusing or doing normal activities, it is acceptable to take some time away from your normal routine. Spend time with friends and loved ones. Eat a healthy diet, get plenty of sleep, and rest when you feel tired. How to recognize changes  The way that you deal with your grief will affect your ability to function as you normally do. When grieving, you may experience these changes: Numbness, shock, sadness, anxiety, anger, denial, and guilt. Thoughts about death. Unexpected crying. A physical sensation of emptiness in your stomach. Problems sleeping and eating. Tiredness (fatigue). Loss of interest in normal  activities. Dreaming about or imagining seeing the person who died. A need to remember what or whom you lost. Difficulty thinking about anything other than your loss for a period of time. Relief. If you have been expecting the loss for a while, you may feel a sense of relief when it happens. Follow these instructions at home:    Activity Express your feelings in healthy ways, such as: Talking with others about your loss. It may be helpful to find others who have had a similar loss, such as a support group. Writing down your feelings in a journal. Doing physical activities to release stress and emotional energy. Doing creative activities like painting, sculpting, or playing or listening to music. Practicing resilience. This is the ability to recover and adjust after facing challenges. Reading some resources that encourage resilience may help you to learn ways to practice those behaviors. General instructions Be patient with yourself and others. Allow the grieving process to happen, and remember that grieving takes time. It is likely that you may never feel completely done with some grief. You may find a way to move on while still cherishing memories and feelings about your loss. Accepting your loss is a process. It can take months or longer to adjust. Keep all follow-up visits as told by your health care provider. This is important. Where to find support To get support for managing loss: Ask your health care provider for help and recommendations, such as grief counseling or therapy. Think about joining a support group for people who are managing a loss.  Where to find more information You can find more information about managing loss from: American Society of Clinical Oncology: www.cancer.net American Psychological Association: [employer] Contact a health care provider if: Your grief is extreme and keeps getting worse. You have ongoing grief that does not improve. Your body shows  symptoms of grief, such as illness. You feel depressed, anxious, or lonely. Get help right away if: You have thoughts about hurting yourself or others. If you ever feel like you may hurt yourself or others, or have thoughts about taking your own life, get help right away. You can go to your nearest emergency department or call: Your local emergency services (911 in the [place] A suicide crisis helpline, such as the National Suicide Prevention Lifeline at 6178413275. This is open 24 hours a day. Summary Grief is the result of a major change or an absence of someone or something that you count on. Grief is a normal reaction to loss. The depth of grief and the period of recovery depend on the type of loss and your ability to adjust to the change and process your feelings. Processing grief requires patience and a willingness to accept your feelings and talk about your loss with people who are supportive. It is important to find resources that work for you and to realize that people experience grief differently. There is not one grieving process that works for everyone in the same way. Be aware that when grief becomes extreme, it can lead to more severe issues like isolation, depression, anxiety, or suicidal thoughts. Talk with your health care provider if you have any of these issues. This information is not intended to replace advice given to you by your health care provider. Make sure you discuss any questions you have with your health care provider. Document Revised: 09/02/2018 Document Reviewed: 11/12/2016 Elsevier Patient Education  2020 [employer].  Help for Managing Grief   When you are experiencing grief, many resources and support are available to help you. You are not alone.    Grief Share (https://www.griefshare.org/):    If a Hospice or Palliative Care team has been involved in the care of your loved one, you may reach out to your contact there or locally, you may reach out to  [facility]  [address] [place], [place], [place] [address] Areas served: [place] and nearby areas Hours:  Open ? Closes 5?PM Phone: [phone]   Please call the Suicide and Crisis Lifeline: 988 call the [place]  National Suicide Prevention Lifeline: (774)606-4045 or TTY: (914)324-0584 TTY 859-601-6487) to talk to a trained counselor call 1-800-273-TALK (toll free, 24 hour hotline) go to [facility] [address] [place] [id number]) call 911 if you are experiencing a Mental Health or Behavioral Health Crisis or need someone to talk to.  Patient verbalizes understanding of instructions and care plan provided today and agrees to view in MyChart. Active MyChart status and patient understanding of how to access instructions and care plan via MyChart confirmed with patient.     Lyle Rung, BSW, MSW, LCSW Licensed Clinical Social Worker [employer]   [email or web address] Direct Dial: [phone]

## 2024-04-13 ENCOUNTER — Other Ambulatory Visit: Payer: Self-pay

## 2024-04-17 ENCOUNTER — Other Ambulatory Visit: Payer: Self-pay | Admitting: Licensed Clinical Social Worker

## 2024-04-17 ENCOUNTER — Other Ambulatory Visit: Payer: Self-pay

## 2024-04-17 NOTE — Patient Outreach (Signed)
 Complex Care Management   Visit Note  04/17/2024  Name:  Jordan Martin MRN: [id number] DOB: 03/14/1996  Situation: Referral received for Complex Care Management related to grief issues experienced  I obtained verbal consent from Patient.  Visit completed with Patient  on the phone  Background:   Past Medical History:  Diagnosis Date   Allergy    Hypertension    Obesity     Assessment: Patient Reported Symptoms:  Cognitive Cognitive Status: Able to follow simple commands, Alert and oriented to person, place, and time Cognitive/Intellectual Conditions Management [RPT]: None reported or documented in medical history or problem list   Health Maintenance Behaviors: Annual physical exam, Stress management Health Facilitated by: Stress management  Neurological Neurological Review of Symptoms: Headaches Neurological Management Strategies: Coping strategies  HEENT HEENT Symptoms Reported: No symptoms reported HEENT Management Strategies: Adequate rest, Coping strategies    Cardiovascular Cardiovascular Symptoms Reported: Fatigue, Dizziness Cardiovascular Management Strategies: Adequate rest, Coping strategies  Respiratory Respiratory Symptoms Reported: No symptoms reported Respiratory Management Strategies: Adequate rest  Endocrine Endocrine Symptoms Reported: Weakness or fatigue    Gastrointestinal Gastrointestinal Symptoms Reported: No symptoms reported Gastrointestinal Management Strategies: Adequate rest, Coping strategies    Genitourinary Genitourinary Symptoms Reported: No symptoms reported Genitourinary Management Strategies: Coping strategies  Integumentary Integumentary Symptoms Reported: No symptoms reported Skin Management Strategies: Coping strategies  Musculoskeletal Musculoskelatal Symptoms Reviewed: No symptoms reported Musculoskeletal Management Strategies: Coping strategies  Some occasional pain from sitting on floor with children and getting back up from floor.  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 Behavioral Management Strategies: Coping strategies Major Change/Loss/Stressor/Fears (CP): Death of a loved one Techniques to Cope with Loss/Stress/Change: Diversional activities, Counseling Quality of Family Relationships: unable to assess Do you feel physically threatened by others?: No Interested in receiving grief counseling support    04/17/2024    PHQ2-9 Depression Screening   Little interest or pleasure in doing things Several days  Feeling down, depressed, or hopeless Several days  PHQ-2 - Total Score 2  Trouble falling or staying asleep, or sleeping too much Not at all  Feeling tired or having little energy Several days  Poor appetite or overeating  Not at all  Feeling bad about yourself - or that you are a failure or have let yourself or your family down Several days  Trouble concentrating on things, such as reading the newspaper or watching television Not at all  Moving or speaking so slowly that other people could have noticed.  Or the opposite - being so fidgety or restless that you have been moving around a lot more than usual Several days  Thoughts that you would be better off dead, or hurting yourself in some way Not at all  PHQ2-9 Total Score 5  If you checked off any problems, how difficult have these problems made it for you to do your work, take care of things at home, or get along with other people Somewhat difficult  Depression Interventions/Treatment Counseling, Community Resources Provided    Vitals:  BP in normal range per client information   Medications Reviewed Today     Reviewed by Frances Ozell GORMAN KEN (Social Worker) on 04/17/24 at 1531  Med List Status: <None>   Medication Order Taking? Sig  Documenting Provider Last Dose Status Informant  amLODipine  (NORVASC ) 10  MG tablet [id number] Yes Take 1 tablet (10 mg total) by mouth daily. (Must have office visit for refills) Celestia Rosaline SQUIBB, NP  Active   hydrochlorothiazide  (HYDRODIURIL ) 25 MG tablet [id number] Yes Take 1 tablet (25 mg total) by mouth daily. Celestia Rosaline SQUIBB, NP  Active   phentermine  15 MG capsule [id number] Yes Take 1 capsule (15 mg total) by mouth every morning.   Active   semaglutide -weight management (WEGOVY ) 0.25 MG/0.5ML SOAJ SQ injection [id number] Not taking  Inject 0.25 mg into the skin once a week.  Patient not taking: Reported on 04/17/2024     Active   topiramate  (TOPAMAX ) 25 MG tablet [id number] Yes Take 1 tablet (25 mg total) by mouth at bedtime to manage migranes   Active             Recommendation:   PCP Follow-up Continue Current Plan of Care Take medications as prescribed Call LCSW Glendia Pear as needed for SW support at [phone]  Follow Up Plan:   LCSW to call client on 06/05/24 at 3:00 PM to discuss client needs at that time   Glendia Pear  MSW, LCSW [place]/Value Based Care [facility] Licensed Clinical Social Worker Direct Dial:  [phone] Fax:  [phone] Website:  [email or web address]

## 2024-04-17 NOTE — Patient Instructions (Signed)
 Visit Information  Thank you for taking time to visit with me today. Please don't hesitate to contact me if I can be of assistance to you before our next scheduled appointment.  Our next appointment is by telephone on 06/05/24 at 3:00 PM   Please call the care guide team at [phone] if you need to cancel or reschedule your appointment.   Following is a copy of your care plan:   Goals Addressed             This Visit's Progress    VBCI Social Work Care Plan       Problems:   Grief issues faced             Management of health issues             Some job related stress              Occasional headaches  CSW Clinical Goal(s):   Over the next 30  days the Patient will attend all scheduled medical appointments as evidenced by patient report and care team review of appointment completion in electronic medical record.             Over next 30 days, client will communicate with counseling agencies in [place], [place] to determine agency with which she may be able to receive counseling support for grief therapy AEB client report of contacting counseling agencies in [place], [place] (discussed [facility], discussed [facility], discussed [facility]).    Interventions:  LCSW spoke with client about client needs and status             LCSW spoke with client about client medication procurement. Discussed client transport needs. 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              Discussed pain issues of client. Discussed sleeping issues; discussed appetite of client.  Discussed program support with RN, LCSW, Pharmacist             Completed assessments for client. Completed PHQ 2/9; completed GAD-7              Discussed grief issues of client. She said she is experiencing grief issues over the death of her father 20 years ago. She said she needs to speak with counselor about grief issues faced.  LCSW spoke with Blake about [facility] and encouraged Shalaunda to first contact [facility] to see if LCSW Lyle Rung had sent referral for client to [facility].  If referral for client was not received by [facility], then LCSW encouraged Janavia to call [facility] and discuss counseling support services with that agency. .Also, LCSW gave Lurdes the name and phone number for [facility] in [place], [place] for her to contact that agency if needed.    Patient Goals/Self-Care Activities:           Call counseling agencies listed above to inquire about possible counseling support for client through one of these counseling agencies             Call LCSW as needed for SW support         Attend appointments with NP as scheduled             Take medications as prescribed  Plan:   Telephone follow up appointment with care management team member scheduled for:  06/05/24 at 3:00 PM         Please go to [facility] [address] [place] [phone]) if you are experiencing a Mental Health or Behavioral Health Crisis or need someone to talk to.  The patient verbalized understanding of instructions, educational materials, and care plan provided today and DECLINED offer to receive copy of patient instructions, educational materials, and care plan.    Glendia Pear  MSW, LCSW [place]/[facility] Licensed Clinical Social Worker Direct Dial:  [phone] Fax:  [phone] Website:  [email or web address]

## 2024-04-19 ENCOUNTER — Other Ambulatory Visit: Payer: Self-pay | Admitting: Medical Genetics

## 2024-04-19 DIAGNOSIS — Z006 Encounter for examination for normal comparison and control in clinical research program: Secondary | ICD-10-CM

## 2024-04-20 ENCOUNTER — Telehealth: Payer: Self-pay

## 2024-04-20 ENCOUNTER — Other Ambulatory Visit: Payer: Self-pay

## 2024-04-20 ENCOUNTER — Telehealth (INDEPENDENT_AMBULATORY_CARE_PROVIDER_SITE_OTHER): Payer: Self-pay | Admitting: *Deleted

## 2024-04-20 DIAGNOSIS — I10 Essential (primary) hypertension: Secondary | ICD-10-CM

## 2024-04-20 DIAGNOSIS — Z760 Encounter for issue of repeat prescription: Secondary | ICD-10-CM

## 2024-04-20 MED ORDER — HYDROCHLOROTHIAZIDE 25 MG PO TABS
25.0000 mg | ORAL_TABLET | Freq: Every day | ORAL | 1 refills | Status: AC
  Filled 2024-04-20: qty 30, 30d supply, fill #0
  Filled 2024-05-19: qty 30, 30d supply, fill #1
  Filled 2024-06-26: qty 30, 30d supply, fill #2
  Filled 2024-08-10: qty 30, 30d supply, fill #3

## 2024-04-20 MED ORDER — AMLODIPINE BESYLATE 10 MG PO TABS
10.0000 mg | ORAL_TABLET | Freq: Every day | ORAL | 1 refills | Status: AC
  Filled 2024-04-20: qty 30, 30d supply, fill #0
  Filled 2024-05-19: qty 30, 30d supply, fill #1
  Filled 2024-06-26: qty 30, 30d supply, fill #2
  Filled 2024-08-10: qty 30, 30d supply, fill #3

## 2024-04-20 NOTE — Telephone Encounter (Signed)
 Medication sent to pharmacy today by [place], RN.

## 2024-04-20 NOTE — Telephone Encounter (Signed)
 Copied from CRM [id number]. Topic: Clinical - Medication Question >> Apr 20, 2024  1:54 PM Cleave MATSU wrote: Reason for CRM: pt stated that for her future refills she [facility]

## 2024-04-20 NOTE — Telephone Encounter (Signed)
 Copied from CRM #[id number]. Topic: Clinical - Prescription Issue >> Apr 20, 2024 10:59 AM Selinda RAMAN wrote: Reason for CRM: The patient called in stating she never received her hydrochlorothiazide  (HYDRODIURIL ) 25 MG tablet or her amLODipine  (NORVASC ) 10 MG tablet medicine and she only has 1 more pill of each left. It shows the transmission failed on 9/10 and it looks like it may have gone through on the 11th but she never got it. She was told it would go straight to her home through that pharmacy which is through her work. Please assist patient as soon as possible and send it to the   [facility]  Phone: [phone] Fax: [phone]   So she can get it as soon as possible and let her know when it has been called in

## 2024-04-21 NOTE — Telephone Encounter (Signed)
 Noted

## 2024-04-23 ENCOUNTER — Other Ambulatory Visit: Payer: Self-pay

## 2024-04-24 ENCOUNTER — Other Ambulatory Visit: Payer: Self-pay

## 2024-04-24 MED ORDER — PHENTERMINE HCL 15 MG PO CAPS
15.0000 mg | ORAL_CAPSULE | Freq: Every day | ORAL | 0 refills | Status: DC
  Filled 2024-04-24: qty 30, 30d supply, fill #0

## 2024-04-28 ENCOUNTER — Telehealth: Admitting: Licensed Clinical Social Worker

## 2024-04-29 LAB — GENECONNECT MOLECULAR SCREEN: Genetic Analysis Overall Interpretation: NEGATIVE

## 2024-05-03 ENCOUNTER — Other Ambulatory Visit: Payer: Self-pay

## 2024-05-09 ENCOUNTER — Other Ambulatory Visit: Payer: Self-pay

## 2024-05-17 ENCOUNTER — Other Ambulatory Visit: Payer: Self-pay

## 2024-05-17 MED ORDER — BUPROPION HCL ER (XL) 150 MG PO TB24
150.0000 mg | ORAL_TABLET | Freq: Every day | ORAL | 0 refills | Status: DC
  Filled 2024-05-17: qty 30, 30d supply, fill #0

## 2024-05-17 MED ORDER — NALTREXONE HCL 50 MG PO TABS
25.0000 mg | ORAL_TABLET | Freq: Every day | ORAL | 0 refills | Status: DC
  Filled 2024-05-17: qty 15, 30d supply, fill #0

## 2024-05-29 ENCOUNTER — Other Ambulatory Visit: Payer: Self-pay

## 2024-05-30 ENCOUNTER — Other Ambulatory Visit: Payer: Self-pay

## 2024-06-05 ENCOUNTER — Other Ambulatory Visit: Payer: Self-pay | Admitting: Licensed Clinical Social Worker

## 2024-06-05 NOTE — Patient Outreach (Signed)
 Complex Care Management   Visit Note  06/05/2024  Name:  Jordan Martin MRN: [id number] DOB: [birth year]  Situation: Referral received for Complex Care Management related to mental health needs; grief issues I obtained verbal consent from Patient.  Visit completed with Patient  on the phone  Background:   Past Medical History:  Diagnosis Date   Allergy    Hypertension    Obesity     Assessment: Patient Reported Symptoms:  Cognitive Cognitive Status: Alert and oriented to person, place, and time, Able to follow simple commands Cognitive/Intellectual Conditions Management [RPT]: None reported or documented in medical history or problem list   Health Maintenance Behaviors: Stress management Health Facilitated by: Stress management  Neurological Neurological Review of Symptoms: Headaches Neurological Management Strategies: Coping strategies  HEENT HEENT Symptoms Reported: No symptoms reported HEENT Management Strategies: Coping strategies    Cardiovascular Cardiovascular Symptoms Reported: Fatigue, Dizziness Cardiovascular Management Strategies: Coping strategies  Respiratory Respiratory Symptoms Reported: No symptoms reported Respiratory Management Strategies: Coping strategies  Endocrine Endocrine Symptoms Reported: Weakness or fatigue, Headaches    Gastrointestinal Gastrointestinal Symptoms Reported: No symptoms reported Gastrointestinal Management Strategies: Coping strategies    Genitourinary Genitourinary Symptoms Reported: No symptoms reported Genitourinary Management Strategies: Coping strategies  Integumentary Integumentary Symptoms Reported: No symptoms reported Skin Management Strategies: Coping strategies  Musculoskeletal Musculoskelatal Symptoms Reviewed: No symptoms reported Musculoskeletal Management Strategies: Coping strategies      Psychosocial Psychosocial Symptoms Reported: Sadness - if selected complete PHQ 2-9, Depression - if selected complete PHQ  2-9 Additional Psychological Details: grief issues experienced.  Father passed away 20 years ago. Behavioral Management Strategies: Coping strategies Major Change/Loss/Stressor/Fears (CP): Death of a loved one Techniques to Cope with Loss/Stress/Change: Diversional activities, Counseling Quality of Family Relationships: supportive Do you feel physically threatened by others?: No    06/05/2024    PHQ2-9 Depression Screening   Little interest or pleasure in doing things Several days  Feeling down, depressed, or hopeless Several days  PHQ-2 - Total Score 2  Trouble falling or staying asleep, or sleeping too much Not at all  Feeling tired or having little energy Several days  Poor appetite or overeating  Several days  Feeling bad about yourself - or that you are a failure or have let yourself or your family down Several days  Trouble concentrating on things, such as reading the newspaper or watching television Several days  Moving or speaking so slowly that other people could have noticed.  Or the opposite - being so fidgety or restless that you have been moving around a lot more than usual Several days  Thoughts that you would be better off dead, or hurting yourself in some way Not at all  PHQ2-9 Total Score 7  If you checked off any problems, how difficult have these problems made it for you to do your work, take care of things at home, or get along with other people Somewhat difficult  Depression Interventions/Treatment Counseling    Today's Vitals  BP for client is being monitored through client's PCP office  Pain Scale: 0-10 Pain Score: 4  Pain Type: Chronic pain Pain Location: Head Pain Descriptors / Indicators: Aching Patients Stated Pain Goal: 1 Pain Intervention(s): Relaxation  Medications Reviewed Today     Reviewed by Frances Ozell GORMAN, LCSW (Social Worker) on 06/05/24 at 1500  Med List Status: <None>   Medication Order Taking? Sig Documenting Provider Last Dose  Status Informant  amLODipine  (NORVASC ) 10 MG tablet [id number] Yes Take 1 tablet (  10 mg total) by mouth daily. Celestia Rosaline SQUIBB, NP  Active   buPROPion  (WELLBUTRIN  XL) 150 MG 24 hr tablet [id number] Not taking  Take 1 tablet (150 mg total) by mouth daily.  Patient not taking: Reported on 06/05/2024     Active   hydrochlorothiazide  (HYDRODIURIL ) 25 MG tablet [id number] Yes Take 1 tablet (25 mg total) by mouth daily. Celestia Rosaline SQUIBB, NP  Active   naltrexone  (DEPADE) 50 MG tablet [id number] Not taking  Take 0.5 tablets (25 mg total) by mouth daily.  Patient not taking: Reported on 06/05/2024     Active   phentermine  15 MG capsule [id number] Not taking  Take 1 capsule (15 mg total) by mouth daily for weight management.  Patient not taking: Reported on 06/05/2024     Active   semaglutide -weight management (WEGOVY ) 0.25 MG/0.5ML SOAJ SQ injection [id number] Not taking  Inject 0.25 mg into the skin once a week.  Patient not taking: Reported on 06/05/2024     Active   topiramate  (TOPAMAX ) 25 MG tablet [id number] Not taking  Take 1 tablet (25 mg total) by mouth at bedtime to manage migranes  Patient not taking: Reported on 06/05/2024     Active             Recommendation:   PCP Follow-up Continue Current Plan of Care Call LCSW as needed for SW support Call counseling agencies of choice to inquire about possible counseling support for client. (Discussed [facility]; discussed [facility]; discussed [facility])  Follow Up Plan:   Telephone follow up appointment date/time:  07/11/24 at 10:30 AM   Glendia Pear  MSW, LCSW [place]/Value Based Care [facility] Licensed Clinical Social Worker Direct Dial:  [phone] Fax:  [phone] Website:  [email or web address]

## 2024-06-05 NOTE — Patient Instructions (Signed)
 Visit Information  Thank you for taking time to visit with me today. Please don't hesitate to contact me if I can be of assistance to you before our next scheduled appointment.  Our next appointment is by telephone on 07/11/24 at 10:30 AM   Please call the care guide team at [phone] if you need to cancel or reschedule your appointment.   Following is a copy of your care plan:   Goals Addressed             This Visit's Progress    VBCI Social Work Care Plan       Problems:   Grief issues faced             Management of health issues             Some job related stress              Occasional headaches  CSW Clinical Goal(s):   Over the next 30  days the Patient will attend all scheduled medical appointments as evidenced by patient report and care team review of appointment completion in electronic medical record.             Over next 30 days, client will communicate with counseling agencies in [place], [place] to determine agency with which she may be able to receive counseling support for grief therapy AEB client report of contacting counseling agencies in [place], [place] (discussed [facility], discussed [facility], discussed [facility]).    Interventions:  LCSW spoke with client about client needs and status             LCSW spoke with client about client medication procurement. Discussed client transport needs. 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              Discussed pain issues of client. She said she has periodic headaches              Discussed  appetite of client.  Discussed program support with RN, LCSW, Pharmacist             Completed assessments for client as needed.  Completed PHQ 2/9.           Discussed grief issues of client. She said she is experiencing grief issues over the death of her father 20 years ago. She said she needs to  speak with counselor about grief issues faced. LCSW spoke with Arthea about [facility] and encouraged Sabrina to first contact [facility] to see if LCSW Lyle Rung had sent referral for client to [facility].  If referral for client was not received by [facility], then LCSW encouraged Caprina to call [facility] and discuss counseling support services with that agency. .Also, LCSW gave Curley the name and phone number for [facility] agency in [place], [place] for her to contact that agency if needed.  Ashlea reported that she had not been contacted by [facility] and so she will likely call [facility] and [facility] to inquire about counseling support options through those agencies for client            Client likes to relax by playing computer games, building with Legos, and taking a nap.  Client likes to sometimes do activities on her own.  She has friend she enjoys talking to and sometimes likes to do activities with friends          Client said she has reduced sleep.  She resides with her mother and her mother is supportive.         Client said she has [employer].            Encouraged client to call LCSW for SW support at [phone]  Patient Goals/Self-Care Activities:           Call counseling agencies listed above to inquire about possible counseling support for client through one of these counseling agencies             Call LCSW as needed for SW support         Attend appointments with NP as scheduled             Take medications as prescribed              Plan:   Telephone follow up appointment with care management team member scheduled for:  07/11/24 at 10:30 AM         Please go to [facility] [address] [place] [phone]) if you are experiencing a Mental Health or Behavioral Health Crisis or need someone to talk to.  Patient verbalized understanding of Care plan and visit instructions communicated  this visit   Glendia Pear  MSW, LCSW [place]/Value Based Care [facility] Licensed Clinical Social Worker Direct Dial:  [phone] Fax:  [phone] Website:  [email or web address]

## 2024-06-12 ENCOUNTER — Ambulatory Visit (INDEPENDENT_AMBULATORY_CARE_PROVIDER_SITE_OTHER): Admitting: Primary Care

## 2024-06-12 ENCOUNTER — Encounter (INDEPENDENT_AMBULATORY_CARE_PROVIDER_SITE_OTHER): Payer: Self-pay | Admitting: Primary Care

## 2024-06-12 VITALS — BP 142/88 | HR 64 | Resp 16 | Wt 235.6 lb

## 2024-06-12 DIAGNOSIS — Z8249 Family history of ischemic heart disease and other diseases of the circulatory system: Secondary | ICD-10-CM | POA: Diagnosis not present

## 2024-06-12 DIAGNOSIS — I10 Essential (primary) hypertension: Secondary | ICD-10-CM

## 2024-06-12 NOTE — Progress Notes (Signed)
 [facility]  Jordan Martin, is a 28 y.o. female  RDW:[id number]  FMW:[id number]  DOB - 02-May-1996  Chief Complaint  Patient presents with   Hypertension       Subjective:   Jordan Martin is a 28 y.o. female here today for a follow up visit. Patient has No headache, No chest pain, No abdominal pain - No Nausea, No new weakness tingling or numbness, No Cough - shortness of breath. Bp elevated admits to not taking medications through the holidays. Not changing meds return 3-4 weeks on  Bp meds and will f/u with Dr. Medford office visit this week. Father died of 62 MI. Grandfather CVA 64 fraternal. Maternal grandfather CVA late 68's.  No problems updated.  Comprehensive ROS Pertinent positive and negative noted in HPI   No Known Allergies  Past Medical History:  Diagnosis Date   Allergy    Hypertension    Obesity     Current Outpatient Medications on File Prior to Visit  Medication Sig Dispense Refill   amLODipine  (NORVASC ) 10 MG tablet Take 1 tablet (10 mg total) by mouth daily. 90 tablet 1   hydrochlorothiazide  (HYDRODIURIL ) 25 MG tablet Take 1 tablet (25 mg total) by mouth daily. 90 tablet 1   No current facility-administered medications on file prior to visit.   Health Maintenance  Topic Date Due   Hepatitis B Vaccine (1 of 3 - 19+ 3-dose series) Never done   COVID-19 Vaccine (3 - 2025-26 season) 03/13/2024   Flu Shot  10/10/2024*   Pap Smear  08/23/2026   DTaP/Tdap/Td vaccine (3 - Td or Tdap) 09/25/2030   HPV Vaccine  Completed   Hepatitis C Screening  Completed   HIV Screening  Completed   Pneumococcal Vaccine  Aged Out   Meningitis B Vaccine  Aged Out  *Topic was postponed. The date shown is not the original due date.    Objective:   Vitals:   06/12/24 1407  BP: (!) 150/92  Pulse: 64  Resp: 16  SpO2: 100%  Weight: 235 lb 9.6 oz (106.9 kg)   BP Readings from Last 3 Encounters:  06/12/24 (!) 150/92  03/08/24 125/78  02/07/24 111/75       Physical Exam Vitals reviewed.  Constitutional:      Appearance: Normal appearance. She is obese.  HENT:     Head: Normocephalic.     Right Ear: Tympanic membrane, ear canal and external ear normal.     Left Ear: Tympanic membrane, ear canal and external ear normal.     Nose: Nose normal.     Mouth/Throat:     Mouth: Mucous membranes are moist.  Eyes:     Extraocular Movements: Extraocular movements intact.     Pupils: Pupils are equal, round, and reactive to light.  Cardiovascular:     Rate and Rhythm: Normal rate and regular rhythm.  Pulmonary:     Effort: Pulmonary effort is normal.     Breath sounds: Normal breath sounds.  Abdominal:     General: Bowel sounds are normal.     Palpations: Abdomen is soft.  Musculoskeletal:        General: Normal range of motion.     Cervical back: Normal range of motion.  Skin:    General: Skin is warm and dry.  Neurological:     Mental Status: She is alert and oriented to person, place, and time.  Psychiatric:        Mood and Affect: Mood normal.  Behavior: Behavior normal.        Thought Content: Thought content normal.      Assessment & Plan   Brittinee was seen today for hypertension.  Diagnoses and all orders for this visit:  Essential hypertension See HPI  BP goal - < 130/80 Explained that having normal blood pressure is the goal and medications are helping to get to goal and maintain normal blood pressure. DIET: Limit salt intake, read nutrition labels to check salt content, limit fried and high fatty foods  Avoid using multisymptom OTC cold preparations that generally contain sudafed which can rise BP. Consult with pharmacist on best cold relief products to use for persons with HTN EXERCISE Discussed incorporating exercise such as walking - 30 minutes most days of the week and can do in 10 minute intervals      Patient have been counseled extensively about nutrition and exercise. Other issues discussed during  this visit include: low cholesterol diet, weight control and daily exercise, foot care, annual eye examinations at Ophthalmology, importance of adherence with medications and regular follow-up. We also discussed long term complications of uncontrolled diabetes and hypertension.    The patient was given clear instructions to go to ER or return to medical center if symptoms don't improve, worsen or new problems develop. The patient verbalized understanding. The patient was told to call to get lab results if they haven't heard anything in the next week.   This note has been created with Education officer, environmental. Any transcriptional errors are unintentional.   Jordan SHAUNNA Bohr, NP 06/12/2024, 2:18 PM

## 2024-06-19 ENCOUNTER — Other Ambulatory Visit: Payer: Self-pay

## 2024-06-19 MED ORDER — WEGOVY 0.25 MG/0.5ML ~~LOC~~ SOAJ
0.2500 mg | SUBCUTANEOUS | 0 refills | Status: DC
  Filled 2024-06-19: qty 2, 28d supply, fill #0

## 2024-06-19 MED ORDER — ESCITALOPRAM OXALATE 5 MG PO TABS
5.0000 mg | ORAL_TABLET | Freq: Every day | ORAL | 0 refills | Status: DC
  Filled 2024-06-19: qty 30, 30d supply, fill #0

## 2024-06-20 ENCOUNTER — Other Ambulatory Visit: Payer: Self-pay

## 2024-06-22 ENCOUNTER — Other Ambulatory Visit: Payer: Self-pay

## 2024-06-29 ENCOUNTER — Other Ambulatory Visit: Payer: Self-pay

## 2024-07-11 ENCOUNTER — Other Ambulatory Visit: Payer: Self-pay | Admitting: Licensed Clinical Social Worker

## 2024-07-11 ENCOUNTER — Other Ambulatory Visit: Payer: Self-pay

## 2024-07-11 NOTE — Patient Outreach (Signed)
 Complex Care Management   Visit Note  07/11/2024  Name:  Jordan Martin MRN: [id number] DOB: 12/19/1995  Situation: Referral received for Complex Care Management related to grief issues faced I obtained verbal consent from Patient.  Visit completed with Patient  on the phone  Background:   Past Medical History:  Diagnosis Date   Allergy    Hypertension    Obesity     Assessment: Patient Reported Symptoms:  Cognitive Cognitive Status: Alert and oriented to person, place, and time Cognitive/Intellectual Conditions Management [RPT]: None reported or documented in medical history or problem list   Health Maintenance Behaviors: Stress management Health Facilitated by: Stress management  Neurological Neurological Review of Symptoms: Headaches Neurological Management Strategies: Coping strategies  HEENT HEENT Symptoms Reported: Sudden change or loss of vision (wears glasses) HEENT Management Strategies: Coping strategies    Cardiovascular Cardiovascular Symptoms Reported: Dizziness, Fatigue Cardiovascular Management Strategies: Coping strategies  Respiratory Respiratory Symptoms Reported: No symptoms reported Respiratory Management Strategies: Coping strategies  Endocrine Endocrine Symptoms Reported: Weakness or fatigue, Headaches    Gastrointestinal Gastrointestinal Symptoms Reported: No symptoms reported Gastrointestinal Management Strategies: Coping strategies    Genitourinary Genitourinary Symptoms Reported: No symptoms reported Genitourinary Management Strategies: Coping strategies  Integumentary Integumentary Symptoms Reported: Skin changes Additional Integumentary Details: dry skin Skin Management Strategies: Coping strategies  Musculoskeletal Musculoskelatal Symptoms Reviewed: No symptoms reported Musculoskeletal Management Strategies: Coping strategies      Psychosocial Psychosocial Symptoms Reported: Sadness - if selected complete PHQ 2-9, Anxiety - if selected  complete GAD, Depression - if selected complete PHQ 2-9 Behavioral Management Strategies: Coping strategies Major Change/Loss/Stressor/Fears (CP): Medical condition, self Techniques to Cope with Loss/Stress/Change: Counseling, Diversional activities Quality of Family Relationships: supportive Do you feel physically threatened by others?: No    07/11/2024    PHQ2-9 Depression Screening   Little interest or pleasure in doing things Several days  Feeling down, depressed, or hopeless Several days  PHQ-2 - Total Score 2  Trouble falling or staying asleep, or sleeping too much Not at all  Feeling tired or having little energy Several days  Poor appetite or overeating  Several days  Feeling bad about yourself - or that you are a failure or have let yourself or your family down Several days  Trouble concentrating on things, such as reading the newspaper or watching television Several days  Moving or speaking so slowly that other people could have noticed.  Or the opposite - being so fidgety or restless that you have been moving around a lot more than usual Several days  Thoughts that you would be better off dead, or hurting yourself in some way Not at all  PHQ2-9 Total Score 7  If you checked off any problems, how difficult have these problems made it for you to do your work, take care of things at home, or get along with other people Somewhat difficult  Depression Interventions/Treatment Counseling    Today's Vitals  BP within normal range per client  Pain Scale: 0-10 Pain Score: 4  Pain Type: Other (Comment) (occasional headaches) Pain Location: Head Pain Descriptors / Indicators: Aching Pain Intervention(s): Relaxation  Medications Reviewed Today     Reviewed by Frances Ozell GORMAN, LCSW (Social Worker) on 07/11/24 at 1108  Med List Status: <None>   Medication Order Taking? Sig Documenting Provider Last Dose Status Informant  amLODipine  (NORVASC ) 10 MG tablet [id number] Yes Take 1  tablet (10 mg total) by mouth daily. Celestia Rosaline SQUIBB, NP  Active  escitalopram  (LEXAPRO ) 5 MG tablet [id number] Yes Take 1 tablet (5 mg total) by mouth daily.   Active   hydrochlorothiazide  (HYDRODIURIL ) 25 MG tablet [id number] Yes Take 1 tablet (25 mg total) by mouth daily. Celestia Rosaline SQUIBB, NP  Active   semaglutide -weight management (WEGOVY ) 0.25 MG/0.5ML SOAJ SQ injection [id number] Not taking  Inject 0.25 mg into the skin once a week.  Patient not taking: Reported on 07/11/2024     Active             Recommendation:   PCP Follow-up Continue Current Plan of Care Call LCSW as needed for SW support Participate in virtual counseling appointment this Friday with counselor from [facility] counseling agency Allow time for rest and relaxation  Follow Up Plan:   Telephone follow up appointment date/time:  08/15/2024 at 2:30 PM    Glendia Pear  MSW, LCSW [place]/Value Based Care [facility] Licensed Clinical Social Worker Direct Dial:  [phone] Fax:  [phone] Website:  [email or web address]

## 2024-07-11 NOTE — Patient Instructions (Signed)
 Visit Information  Thank you for taking time to visit with me today. Please don't hesitate to contact me if I can be of assistance to you before our next scheduled appointment.  Our next appointment is by telephone on 08/15/2024 at 2:00 PM   Please call the care guide team at [phone] if you need to cancel or reschedule your appointment.   Following is a copy of your care plan:   Goals Addressed             This Visit's Progress    VBCI Social Work Care Plan       Problems:   Grief issues faced             Management of health issues             Some job related stress              Occasional headaches  CSW Clinical Goal(s):   Over the next 30  days the Patient will attend all scheduled medical appointments as evidenced by patient report and care team review of appointment completion in electronic medical record.             Over next 30 days, client will participate in counseling support activities and appointments as scheduled AEB client report of participating in counseling appointments for client.    Interventions:  LCSW spoke with client about client needs and status              Client said she has counseling appointment this Friday in [place], [place] (virtual appointment). Counselor is with agency:  [employer] [facility].  Client said this will be first appointment with counselor             LCSW spoke with client about client medication procurement. Discussed client transport needs. 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              Discussed pain issues of client. She said she has periodic headaches              Discussed  appetite of client.  Discussed program support with RN, LCSW, Pharmacist             Completed assessments for client as needed.  Completed PHQ 2/9.     Completed GAD-7.  Discussed grief issues of client. She said she is experiencing grief issues over the death  of her father 20 years ago.             Client likes to relax by playing computer games, building with Legos, and taking a nap.  Client likes to sometimes do activities on her own.  She has friend she enjoys talking to and sometimes likes to do activities with friends          Client said she has reduced sleep.  She resides with her mother and her mother is supportive. Client shares some meals with her family members           Client said she has [employer].               Provided counseling support          Used Active Listening skills to hear needs of client          Encouraged client to  call LCSW for SW support at [phone]             Client was appreciative of call from LCSW today  Patient Goals/Self-Care Activities:          Client to participate in virtual counseling appointment this Friday with counselor from agency, [facility] LCSW as needed for SW support         Attend appointments with NP as scheduled             Take medications as prescribed         Allow time for stress reduction activities of choice              Plan:   Telephone follow up appointment with care management team member scheduled for:  08/15/2024 at 2;00 PM         Please go to [facility] [address] [place] [phone]) if you are experiencing a Mental Health or Behavioral Health Crisis or need someone to talk to.  Patient verbalized understanding of Care plan and visit instructions communicated this visit   Jordan Martin  MSW, LCSW [place]/Value Based Care [facility] Licensed Clinical Social Worker Direct Dial:  [phone] Fax:  [phone] Website:  [email or web address]

## 2024-07-20 NOTE — Progress Notes (Unsigned)
" ° °  Cardiology [facility]:    Date:  07/20/2024   ID:  ERLA BACCHI, DOB [birth year], MRN [id number]  PCP:  Celestia Rosaline SQUIBB, NP  Cardiologist:  None { Click to update primary MD,subspecialty MD or APP then REFRESH:1}    Referring MD: Celestia Rosaline SQUIBB, NP   Chief Complaint: hypertension  History of Present Illness:    Jordan Martin is a 29 y.o. female with a history of hypertension and obesity who presents today as a new patient in the [facility] for further evaluation of hypertension.   ***  Hypertension  Obesity  EKGs/Labs/Other Studies Reviewed:    The following studies were reviewed:  N/A  EKG:  EKG ordered today. EKG personally reviewed and demonstrates ***.  Recent Labs: No results found for requested labs within last 365 days.  Recent Lipid Panel    Component Value Date/Time   CHOL 138 10/05/2022 1128   TRIG 38 10/05/2022 1128   HDL 57 10/05/2022 1128   CHOLHDL 2.4 10/05/2022 1128   CHOLHDL 3 10/08/2014 0954   VLDL 8.8 10/08/2014 0954   LDLCALC 72 10/05/2022 1128    Physical Exam:    Vital Signs: There were no vitals taken for this visit.    Wt Readings from Last 3 Encounters:  06/12/24 235 lb 9.6 oz (106.9 kg)  03/08/24 226 lb 12.8 oz (102.9 kg)  08/25/23 219 lb (99.3 kg)     General: 29 y.o. female in no acute distress. HEENT: Normocephalic and atraumatic. Sclera clear.  Neck: Supple. No carotid bruits. No JVD. Heart: *** RRR. Distinct S1 and S2. No murmurs, gallops, or rubs.  Lungs: No increased work of breathing. Clear to ausculation bilaterally. No wheezes, rhonchi, or rales.  Abdomen: Soft, non-distended, and non-tender to palpation.  Extremities: No lower extremity edema.  Radial and distal pedal pulses 2+ and equal bilaterally. Skin: Warm and dry. Neuro: No focal deficits. Psych: Normal affect. Responds appropriately.   Assessment:    No diagnosis found.  Plan:     Disposition: Follow up in  ***   Signed, Maliq Pilley E Kelsi Benham, PA-C  07/20/2024 7:37 AM    [facility] "

## 2024-07-22 ENCOUNTER — Other Ambulatory Visit: Payer: Self-pay

## 2024-07-22 MED ORDER — ESCITALOPRAM OXALATE 5 MG PO TABS
5.0000 mg | ORAL_TABLET | ORAL | 1 refills | Status: AC
  Filled 2024-07-22 – 2024-08-10 (×2): qty 30, 30d supply, fill #0

## 2024-07-23 ENCOUNTER — Other Ambulatory Visit: Payer: Self-pay

## 2024-07-25 ENCOUNTER — Encounter: Payer: Self-pay | Admitting: *Deleted

## 2024-07-25 ENCOUNTER — Ambulatory Visit: Attending: Student | Admitting: Student

## 2024-07-25 ENCOUNTER — Encounter: Payer: Self-pay | Admitting: Student

## 2024-07-25 VITALS — BP 130/90 | HR 59 | Ht 64.0 in | Wt 235.0 lb

## 2024-07-25 DIAGNOSIS — Z8249 Family history of ischemic heart disease and other diseases of the circulatory system: Secondary | ICD-10-CM | POA: Diagnosis not present

## 2024-07-25 DIAGNOSIS — I10 Essential (primary) hypertension: Secondary | ICD-10-CM | POA: Diagnosis not present

## 2024-07-25 NOTE — Patient Instructions (Addendum)
 Medication Instructions:  Your physician recommends that you continue on your current medications as directed. Please refer to the Current Medication list given to you today.  *If you need a refill on your cardiac medications before your next appointment, please call your pharmacy*  Lab Work: TODAY:  [facility]  If you have labs (blood work) drawn today and your tests are completely normal, you will receive your results only by: MyChart Message (if you have MyChart) OR A paper copy in the mail If you have any lab test that is abnormal or we need to change your treatment, we will call you to review the results.  Testing/Procedures: Your physician has requested that you have an echocardiogram. Echocardiography is a painless test that uses sound waves to create images of your heart. It provides your doctor with information about the size and shape of your heart and how well your hearts chambers and valves are working. This procedure takes approximately one hour. There are no restrictions for this procedure. Please do NOT wear cologne, perfume, aftershave, or lotions (deodorant is allowed). Please arrive 15 minutes prior to your appointment time.  Please note: We ask at that you not bring children with you during ultrasound (echo/ vascular) testing. Due to room size and safety concerns, children are not allowed in the ultrasound rooms during exams. Our front office staff cannot provide observation of children in our lobby area while testing is being conducted. An adult accompanying a patient to their appointment will only be allowed in the ultrasound room at the discretion of the ultrasound technician under special circumstances. We apologize for any inconvenience.    Your physician has recommended that you have a sleep study. This test records several body functions during sleep, including: brain activity, eye movement, oxygen and carbon dioxide blood levels, heart rate and rhythm, breathing rate and  rhythm, the flow of air through your mouth and nose, snoring, body muscle movements, and chest and belly movement.     Follow-Up: At [facility], you and your health needs are our priority.  As part of our continuing mission to provide you with exceptional heart care, our providers are all part of one team.  This team includes your primary Cardiologist (physician) and Advanced Practice Providers or APPs (Physician Assistants and Nurse Practitioners) who all work together to provide you with the care you need, when you need it.  Your next appointment:   3 month(s)  Provider:   Callie Goodrich, PA-C          We recommend signing up for the patient portal called MyChart.  Sign up information is provided on this After Visit Summary.  MyChart is used to connect with patients for Virtual Visits (Telemedicine).  Patients are able to view lab/test results, encounter notes, upcoming appointments, etc.  Non-urgent messages can be sent to your provider as well.   To learn more about what you can do with MyChart, go to [email or web address].   Other Instructions  Your physician has requested that you regularly monitor and record your blood pressure readings at home. Please use the same machine at the same time of day to check your readings and record them to bring to your follow-up visit.   Please monitor blood pressures and keep a log of your readings 1-2 WEEKS THEN SEND ME A MESSAGE ON MYCHART WITH THE READINGS.    Make sure to check 2 hours after your medications.    AVOID these things for 30 minutes before checking your  blood pressure: No Drinking caffeine. No Drinking alcohol. No Eating. No Smoking. No Exercising.   Five minutes before checking your blood pressure: Pee. Sit in a dining chair. Avoid sitting in a soft couch or armchair. Be quiet. Do not talk        DASH Eating Plan DASH stands for Dietary Approaches to Stop Hypertension. The DASH eating plan is a healthy  eating plan that has been shown to: Lower high blood pressure (hypertension). Reduce your risk for type 2 diabetes, heart disease, and stroke. Help with weight loss. What are tips for following this plan? Reading food labels Check food labels for the amount of salt (sodium) per serving. Choose foods with less than 5 percent of the Daily Value (DV) of sodium. In general, foods with less than 300 milligrams (mg) of sodium per serving fit into this eating plan. To find whole grains, look for the word whole as the first word in the ingredient list. Shopping Buy products labeled as low-sodium or no salt added. Buy fresh foods. Avoid canned foods and pre-made or frozen meals. Cooking Try not to add salt when you cook. Use salt-free seasonings or herbs instead of table salt or sea salt. Check with your health care provider or pharmacist before using salt substitutes. Do not fry foods. Cook foods in healthy ways, such as baking, boiling, grilling, roasting, or broiling. Cook using oils that are good for your heart. These include olive, canola, avocado, soybean, and sunflower oil. Meal planning  Eat a balanced diet. This should include: 4 or more servings of fruits and 4 or more servings of vegetables each day. Try to fill half of your plate with fruits and vegetables. 6-8 servings of whole grains each day. 6 or less servings of lean meat, poultry, or fish each day. 1 oz is 1 serving. A 3 oz (85 g) serving of meat is about the same size as the palm of your hand. One egg is 1 oz (28 g). 2-3 servings of low-fat dairy each day. One serving is 1 cup (237 mL). 1 serving of nuts, seeds, or beans 5 times each week. 2-3 servings of heart-healthy fats. Healthy fats called omega-3 fatty acids are found in foods such as walnuts, flaxseeds, fortified milks, and eggs. These fats are also found in cold-water fish, such as sardines, salmon, and mackerel. Limit how much you eat of: Canned or prepackaged  foods. Food that is high in trans fat, such as fried foods. Food that is high in saturated fat, such as fatty meat. Desserts and other sweets, sugary drinks, and other foods with added sugar. Full-fat dairy products. Do not salt foods before eating. Do not eat more than 4 egg yolks a week. Try to eat at least 2 vegetarian meals a week. Eat more home-cooked food and less restaurant, buffet, and fast food. Lifestyle When eating at a restaurant, ask if your food can be made with less salt or no salt. If you drink alcohol: Limit how much you have to: 0-1 drink a day if you are female. 0-2 drinks a day if you are female. Know how much alcohol is in your drink. In the [place] one drink is one 12 oz bottle of beer (355 mL), one 5 oz glass of wine (148 mL), or one 1 oz glass of hard liquor (44 mL). General information Avoid eating more than 2,300 mg of salt a day. If you have hypertension, you may need to reduce your sodium intake to 1,500 mg a  day. Work with your provider to stay at a healthy body weight or lose weight. Ask what the best weight range is for you. On most days of the week, get at least 30 minutes of exercise that causes your heart to beat faster. This may include walking, swimming, or biking. Work with your provider or dietitian to adjust your eating plan to meet your specific calorie needs. What foods should I eat? Fruits All fresh, dried, or frozen fruit. Canned fruits that are in their natural juice and do not have sugar added to them. Vegetables Fresh or frozen vegetables that are raw, steamed, roasted, or grilled. Low-sodium or reduced-sodium tomato and vegetable juice. Low-sodium or reduced-sodium tomato sauce and tomato paste. Low-sodium or reduced-sodium canned vegetables. Grains Whole-grain or whole-wheat bread. Whole-grain or whole-wheat pasta. Brown rice. [place]neil Madeira. Bulgur. Whole-grain and low-sodium cereals. Pita bread. Low-fat, low-sodium crackers. Whole-wheat flour  tortillas. Meats and other proteins Skinless chicken or turkey. Ground chicken or turkey. Pork with fat trimmed off. Fish and seafood. Egg whites. Dried beans, peas, or lentils. Unsalted nuts, nut butters, and seeds. Unsalted canned beans. Lean cuts of beef with fat trimmed off. Low-sodium, lean precooked or cured meat, such as sausages or meat loaves. Dairy Low-fat (1%) or fat-free (skim) milk. Reduced-fat, low-fat, or fat-free cheeses. Nonfat, low-sodium ricotta or cottage cheese. Low-fat or nonfat yogurt. Low-fat, low-sodium cheese. Fats and oils Soft margarine without trans fats. Vegetable oil. Reduced-fat, low-fat, or light mayonnaise and salad dressings (reduced-sodium). Canola, safflower, olive, avocado, soybean, and sunflower oils. Avocado. Seasonings and condiments Herbs. Spices. Seasoning mixes without salt. Other foods Unsalted popcorn and pretzels. Fat-free sweets. The items listed above may not be all the foods and drinks you can have. Talk to a dietitian to learn more. What foods should I avoid? Fruits Canned fruit in a light or heavy syrup. Fried fruit. Fruit in cream or butter sauce. Vegetables Creamed or fried vegetables. Vegetables in a cheese sauce. Regular canned vegetables that are not marked as low-sodium or reduced-sodium. Regular canned tomato sauce and paste that are not marked as low-sodium or reduced-sodium. Regular tomato and vegetable juices that are not marked as low-sodium or reduced-sodium. Dene. Olives. Grains Baked goods made with fat, such as croissants, muffins, or some breads. Dry pasta or rice meal packs. Meats and other proteins Fatty cuts of meat. Ribs. Fried meat. [place]. Bologna, salami, and other precooked or cured meats, such as sausages or meat loaves, that are not lean and low in sodium. Fat from the back of a pig (fatback). Bratwurst. Salted nuts and seeds. Canned beans with added salt. Canned or smoked fish. Whole eggs or egg yolks. Chicken or  turkey with skin. Dairy Whole or 2% milk, cream, and half-and-half. Whole or full-fat cream cheese. Whole-fat or sweetened yogurt. Full-fat cheese. Nondairy creamers. Whipped toppings. Processed cheese and cheese spreads. Fats and oils Butter. Stick margarine. Lard. Shortening. Ghee. Bacon fat. Tropical oils, such as coconut, palm kernel, or palm oil. Seasonings and condiments Onion salt, garlic salt, seasoned salt, table salt, and sea salt. Worcestershire sauce. Tartar sauce. Barbecue sauce. Teriyaki sauce. Soy sauce, including reduced-sodium soy sauce. Steak sauce. Canned and packaged gravies. Fish sauce. Oyster sauce. Cocktail sauce. Store-bought horseradish. Ketchup. Mustard. Meat flavorings and tenderizers. Bouillon cubes. Hot sauces. Pre-made or packaged marinades. Pre-made or packaged taco seasonings. Relishes. Regular salad dressings. Other foods Salted popcorn and pretzels. The items listed above may not be all the foods and drinks you should avoid. Talk to a dietitian  to learn more. Where to find more information National Heart, Lung, and Blood Institute (NHLBI): buffalodrycleaner.gl American Heart Association (AHA): heart.org Academy of Nutrition and Dietetics: eatright.org National Kidney Foundation (NKF): kidney.org This information is not intended to replace advice given to you by your health care provider. Make sure you discuss any questions you have with your health care provider. Document Revised: 07/16/2022 Document Reviewed: 07/16/2022 Elsevier Patient Education  2024 [employer].

## 2024-07-26 ENCOUNTER — Ambulatory Visit: Payer: Self-pay | Admitting: Student

## 2024-07-26 LAB — LIPOPROTEIN A (LPA): Lipoprotein (a): 35.5 nmol/L

## 2024-08-02 ENCOUNTER — Other Ambulatory Visit: Payer: Self-pay

## 2024-08-03 ENCOUNTER — Ambulatory Visit (HOSPITAL_COMMUNITY)

## 2024-08-10 ENCOUNTER — Other Ambulatory Visit: Payer: Self-pay

## 2024-08-11 ENCOUNTER — Other Ambulatory Visit: Payer: Self-pay

## 2024-08-15 ENCOUNTER — Telehealth: Admitting: Licensed Clinical Social Worker

## 2024-08-22 ENCOUNTER — Telehealth: Admitting: Licensed Clinical Social Worker

## 2024-08-29 ENCOUNTER — Ambulatory Visit (HOSPITAL_COMMUNITY)

## 2024-09-15 ENCOUNTER — Ambulatory Visit (INDEPENDENT_AMBULATORY_CARE_PROVIDER_SITE_OTHER): Admitting: Primary Care

## 2024-10-26 ENCOUNTER — Ambulatory Visit: Admitting: Student
